# Patient Record
Sex: Male | Born: 1960 | Race: White | Hispanic: No | Marital: Married | Smoking: Never smoker
Health system: Southern US, Community
[De-identification: ages and names within clinical notes are randomized; demographics above are authoritative.]

## PROBLEM LIST (undated history)

## (undated) DIAGNOSIS — Z7722 Contact with and (suspected) exposure to environmental tobacco smoke (acute) (chronic): Secondary | ICD-10-CM

## (undated) DIAGNOSIS — L309 Dermatitis, unspecified: Secondary | ICD-10-CM

## (undated) DIAGNOSIS — G4730 Sleep apnea, unspecified: Secondary | ICD-10-CM

## (undated) DIAGNOSIS — T7840XA Allergy, unspecified, initial encounter: Secondary | ICD-10-CM

## (undated) HISTORY — DX: Allergy, unspecified, initial encounter: T78.40XA

## (undated) HISTORY — DX: Sleep apnea, unspecified: G47.30

## (undated) HISTORY — DX: Contact with and (suspected) exposure to environmental tobacco smoke (acute) (chronic): Z77.22

## (undated) HISTORY — DX: Dermatitis, unspecified: L30.9

---
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
## 1998-11-10 HISTORY — PX: OTHER SURGICAL HISTORY: SHX169

## 2005-09-01 ENCOUNTER — Ambulatory Visit (HOSPITAL_COMMUNITY): Admission: RE | Admit: 2005-09-01 | Discharge: 2005-09-01 | Payer: Self-pay | Admitting: *Deleted

## 2008-09-11 ENCOUNTER — Encounter: Admission: RE | Admit: 2008-09-11 | Discharge: 2008-09-11 | Payer: Self-pay | Admitting: Internal Medicine

## 2011-03-28 NOTE — Op Note (Signed)
Thomas Morrow, Thomas Morrow             ACCOUNT NO.:  [id number]   MEDICAL RECORD NO.:  [id number]          PATIENT TYPE:  AMB   LOCATION:  ENDO                         FACILITY:  [facility]   PHYSICIAN:  Georgiana Spinner, M.D.    DATE OF BIRTH:  1961/07/20   DATE OF PROCEDURE:  09/01/2005  DATE OF DISCHARGE:                                 OPERATIVE REPORT   PROCEDURE:  Colonoscopy.   INDICATIONS:  Rectal bleeding.   ANESTHESIA:  Demerol 60, Versed 8.5 mg.   PROCEDURE:  With the patient mildly sedated in the left lateral decubitus  position, the Olympus videoscopic colonoscope was inserted in the rectum  after a rectal examination was performed which was unremarkable by my exam  and we passed the endoscope under direct vision to the cecum identified by  ileocecal valve and base of cecum both of which were photographed. From this  point, the colonoscope was slowly withdrawn taking circumferential views of  the colonic mucosa stopping only in the rectum which appeared normal on  direct and showed hemorrhoids on retroflexed view the endoscope was  straightened and withdrawn. The patient's vital signs and pulse oximeter  remained stable. The patient tolerated the procedure well without apparent  complications.   FINDINGS:  Internal hemorrhoids otherwise an unremarkable examination.   PLAN:  I have discussed endoscopy with him at this time. He told me he did  have some reflux symptoms 5 or 6 years ago but none since and at some point  he may be someone we might want to do an endoscopy on and I discussed that  but he declined it at this time           ______________________________  Georgiana Spinner, M.D.     GMO/MEDQ  D:  09/01/2005  T:  09/01/2005  Job:  [id number]

## 2014-11-20 ENCOUNTER — Ambulatory Visit
Admission: RE | Admit: 2014-11-20 | Discharge: 2014-11-20 | Disposition: A | Discharge status: Home / Self Care | Payer: BLUE CROSS/BLUE SHIELD | Source: Ambulatory Visit | Attending: Sports Medicine | Admitting: Sports Medicine

## 2014-11-20 ENCOUNTER — Encounter: Payer: Self-pay | Admitting: Sports Medicine

## 2014-11-20 ENCOUNTER — Ambulatory Visit (INDEPENDENT_AMBULATORY_CARE_PROVIDER_SITE_OTHER): Payer: BLUE CROSS/BLUE SHIELD | Admitting: Sports Medicine

## 2014-11-20 VITALS — BP 148/100 | HR 97 | Ht 76.0 in | Wt 230.0 lb

## 2014-11-20 DIAGNOSIS — R0781 Pleurodynia: Secondary | ICD-10-CM | POA: Diagnosis not present

## 2014-11-20 DIAGNOSIS — G8929 Other chronic pain: Secondary | ICD-10-CM

## 2014-11-20 DIAGNOSIS — M898X1 Other specified disorders of bone, shoulder: Secondary | ICD-10-CM

## 2014-11-20 NOTE — Assessment & Plan Note (Addendum)
Tenderness to palpation at posterior rib border below scapula.   Plan:  1) Chest X-ray to evaluate for Rib fracture.  2) Patient did not wish to attempt Neurontin to help with pain.  3) Follow up in one month. Might pursue MRI at that time if pain is worsening.   Addendum: X-rays reviewed. X-rays are unremarkable. Based on his chronicity of symptoms I recommended an MRI of his right scapula to rule out bony edema or soft tissue injury as a cause of his chronic parascapular pain. He will check with his insurance company about the cost of the study. Follow-up with me in 4 weeks as we previously discussed.

## 2014-11-20 NOTE — Progress Notes (Signed)
   Subjective:    Patient ID: Thomas Morrow, male    DOB: 1961/01/06, 54 y.o.   MRN: [id number]  HPI Mr. Marcelle OverlieHolland is a 54 year old right handed male with no relevant PMH who presents with a 4 - 5 month history of R "scapula" pain. Around 5 months ago, Mr. Marcelle OverlieHolland noticed a burning sensation on his right side below his scapula. The burning sensation would come and go over the course of a month. After one month, the burning sensation subsided and a new "bruise" like pain emerged. The "bruise" like pain is tenderness upon palpation in the area. He states the pain has become more noticeable over the past 5 months which led him to come to clinic. He denies the pain limiting his activity nor does activity increase his pain. He denies any injury/ trauma to the area. Furthermore, he denies any history of neck/back pain or surgeries. He denies any numbness or tingling in his upper extremities or weakness in his arms. He has not taken or tried anything in this area because there isn't pain unless he palpates the area.    Review of Systems  Gen: No weight loss. No chills.      Objective:   Physical Exam  Well nourished. Well developed. No acute distress.  BP 148/100 mmHg  Pulse 97  Ht 6\' 4"  (1.93 m)  Wt 230 lb (104.327 kg)  BMI 28.01 kg/m2  Back exam: Right scapula more prominent than left scapula. Tenderness to palpation along posterior rib line below scapula. Normal scapula ROM bilaterally. No winging of scapula noted during movement.   Shoulder: No swelling noted bilaterally. Non-tender to palpation bilaterally. Normal ROM bilaterally. 5/5 shoulder abduction, shoulder internal rotation, shoulder external rotation bilaterally.   Neck: No swelling noted. Non-tender to palpation. Neck ROM normal. Negative Spurling test.        Assessment & Plan:     Note written originally by Ave FilterJosh Dilley, MS4 [facility] [place] and edited/reviewed by Dr. Reino Bellisimothy Satara Virella.

## 2014-12-18 ENCOUNTER — Ambulatory Visit (INDEPENDENT_AMBULATORY_CARE_PROVIDER_SITE_OTHER): Payer: BLUE CROSS/BLUE SHIELD | Admitting: Sports Medicine

## 2014-12-18 ENCOUNTER — Encounter: Payer: Self-pay | Admitting: Sports Medicine

## 2014-12-18 VITALS — BP 152/92 | Ht 76.0 in | Wt 232.0 lb

## 2014-12-18 DIAGNOSIS — M898X1 Other specified disorders of bone, shoulder: Secondary | ICD-10-CM

## 2014-12-18 DIAGNOSIS — G8929 Other chronic pain: Secondary | ICD-10-CM | POA: Diagnosis not present

## 2014-12-18 NOTE — Progress Notes (Signed)
   Subjective:    Patient ID: Thomas Morrow, male    DOB: Nov 06, 1961, 54 y.o.   MRN: [id number]  HPI  Patient comes in today for follow-up on right parascapular pain. Pain is still present primarily with direct pressure over the area. Recent x-rays were unremarkable. He denies any pain with scapula or shoulder motion. No associated numbness or tingling.    Review of Systems     Objective:   Physical Exam Well-developed, well-nourished. No acute distress.  Right shoulder: Full range of motion. There is tenderness to palpation in the infraspinatus muscle belly and there is also a small area of spasm here. No soft tissue swelling. Good scapular motion. Neurovascular intact distally.       Assessment & Plan:  Right scapular pain-question infraspinatus muscle spasm  I had previously discussed the possibility of further diagnostic imaging in the form of an MRI but I think we can hold on that for now. Instead, I would like for him to try working with Thomas Morrow (PT) and follow-up with me in 5 weeks. Based on his response to therapy we may reconsider the MRI follow-up and he will call me with questions or concerns in the interim.

## 2015-01-22 ENCOUNTER — Ambulatory Visit (INDEPENDENT_AMBULATORY_CARE_PROVIDER_SITE_OTHER): Payer: BLUE CROSS/BLUE SHIELD | Admitting: Sports Medicine

## 2015-01-22 VITALS — BP 135/87 | HR 85 | Ht 76.0 in | Wt 232.0 lb

## 2015-01-22 DIAGNOSIS — M898X1 Other specified disorders of bone, shoulder: Secondary | ICD-10-CM | POA: Diagnosis not present

## 2015-01-22 DIAGNOSIS — G8929 Other chronic pain: Secondary | ICD-10-CM | POA: Diagnosis not present

## 2015-01-22 NOTE — Progress Notes (Signed)
   Subjective:    Patient ID: Thomas Morrow, male    DOB: 04-Oct-1961, 54 y.o.   MRN: [id number]  HPI  Patient comes in today for follow-up on right parascapular pain. He is feeling much better. He has minimal pain. He saw Ellamae SiaJohn O'Halloran for 3 visits. John was able to apply some electrical stimulation, massage, and stretching. As a result his symptoms are minimal and certainly tolerable. Previous x-rays were unremarkable.   Review of Systems     Objective:   Physical Exam  Well-developed, well-nourished. No acute distress  Right scapula still shows good mobility. No bony or soft tissue tenderness to palpation. No spasm. Good strength.      Assessment & Plan:  Resolved right parascapular pain  This point in time I see no need for further imaging. Patient may resume activity as tolerated. If symptoms begin to return he will let me know. Otherwise follow-up when necessary.

## 2015-01-22 NOTE — Addendum Note (Signed)
Addended by: Ralene CorkRAPER, Elie R on: 01/22/2015 08:38 AM   Modules accepted: Level of Service

## 2016-03-02 DIAGNOSIS — G4733 Obstructive sleep apnea (adult) (pediatric): Secondary | ICD-10-CM | POA: Diagnosis not present

## 2016-04-01 DIAGNOSIS — G4733 Obstructive sleep apnea (adult) (pediatric): Secondary | ICD-10-CM | POA: Diagnosis not present

## 2016-05-02 DIAGNOSIS — H5213 Myopia, bilateral: Secondary | ICD-10-CM | POA: Diagnosis not present

## 2016-05-02 DIAGNOSIS — G4733 Obstructive sleep apnea (adult) (pediatric): Secondary | ICD-10-CM | POA: Diagnosis not present

## 2016-05-19 DIAGNOSIS — E785 Hyperlipidemia, unspecified: Secondary | ICD-10-CM | POA: Diagnosis not present

## 2016-05-19 DIAGNOSIS — R7309 Other abnormal glucose: Secondary | ICD-10-CM | POA: Diagnosis not present

## 2016-05-26 DIAGNOSIS — E785 Hyperlipidemia, unspecified: Secondary | ICD-10-CM | POA: Diagnosis not present

## 2016-05-26 DIAGNOSIS — R7309 Other abnormal glucose: Secondary | ICD-10-CM | POA: Diagnosis not present

## 2016-12-04 DIAGNOSIS — Z125 Encounter for screening for malignant neoplasm of prostate: Secondary | ICD-10-CM | POA: Diagnosis not present

## 2016-12-04 DIAGNOSIS — Z0000 Encounter for general adult medical examination without abnormal findings: Secondary | ICD-10-CM | POA: Diagnosis not present

## 2016-12-15 DIAGNOSIS — Z1212 Encounter for screening for malignant neoplasm of rectum: Secondary | ICD-10-CM | POA: Diagnosis not present

## 2016-12-15 DIAGNOSIS — Z0001 Encounter for general adult medical examination with abnormal findings: Secondary | ICD-10-CM | POA: Diagnosis not present

## 2016-12-15 DIAGNOSIS — Z91018 Allergy to other foods: Secondary | ICD-10-CM | POA: Diagnosis not present

## 2016-12-15 DIAGNOSIS — Z833 Family history of diabetes mellitus: Secondary | ICD-10-CM | POA: Diagnosis not present

## 2016-12-15 DIAGNOSIS — H9319 Tinnitus, unspecified ear: Secondary | ICD-10-CM | POA: Diagnosis not present

## 2016-12-25 DIAGNOSIS — Z800 Family history of malignant neoplasm of digestive organs: Secondary | ICD-10-CM | POA: Diagnosis not present

## 2016-12-25 DIAGNOSIS — Z1211 Encounter for screening for malignant neoplasm of colon: Secondary | ICD-10-CM | POA: Diagnosis not present

## 2016-12-25 DIAGNOSIS — K5900 Constipation, unspecified: Secondary | ICD-10-CM | POA: Diagnosis not present

## 2017-01-21 DIAGNOSIS — R972 Elevated prostate specific antigen [PSA]: Secondary | ICD-10-CM | POA: Diagnosis not present

## 2017-02-24 DIAGNOSIS — K635 Polyp of colon: Secondary | ICD-10-CM | POA: Diagnosis not present

## 2017-02-24 DIAGNOSIS — Z1211 Encounter for screening for malignant neoplasm of colon: Secondary | ICD-10-CM | POA: Diagnosis not present

## 2017-02-24 DIAGNOSIS — D123 Benign neoplasm of transverse colon: Secondary | ICD-10-CM | POA: Diagnosis not present

## 2017-05-05 DIAGNOSIS — H5213 Myopia, bilateral: Secondary | ICD-10-CM | POA: Diagnosis not present

## 2017-05-21 DIAGNOSIS — R7309 Other abnormal glucose: Secondary | ICD-10-CM | POA: Diagnosis not present

## 2017-05-21 DIAGNOSIS — E785 Hyperlipidemia, unspecified: Secondary | ICD-10-CM | POA: Diagnosis not present

## 2017-05-21 DIAGNOSIS — I10 Essential (primary) hypertension: Secondary | ICD-10-CM | POA: Diagnosis not present

## 2017-06-01 DIAGNOSIS — A09 Infectious gastroenteritis and colitis, unspecified: Secondary | ICD-10-CM | POA: Diagnosis not present

## 2017-06-01 DIAGNOSIS — J309 Allergic rhinitis, unspecified: Secondary | ICD-10-CM | POA: Diagnosis not present

## 2017-06-01 DIAGNOSIS — R7309 Other abnormal glucose: Secondary | ICD-10-CM | POA: Diagnosis not present

## 2017-06-01 DIAGNOSIS — I10 Essential (primary) hypertension: Secondary | ICD-10-CM | POA: Diagnosis not present

## 2017-07-22 DIAGNOSIS — R972 Elevated prostate specific antigen [PSA]: Secondary | ICD-10-CM | POA: Diagnosis not present

## 2017-07-29 DIAGNOSIS — R972 Elevated prostate specific antigen [PSA]: Secondary | ICD-10-CM | POA: Diagnosis not present

## 2017-08-04 DIAGNOSIS — Z23 Encounter for immunization: Secondary | ICD-10-CM | POA: Diagnosis not present

## 2017-12-16 DIAGNOSIS — Z0000 Encounter for general adult medical examination without abnormal findings: Secondary | ICD-10-CM | POA: Diagnosis not present

## 2017-12-16 DIAGNOSIS — Z125 Encounter for screening for malignant neoplasm of prostate: Secondary | ICD-10-CM | POA: Diagnosis not present

## 2017-12-21 DIAGNOSIS — Z23 Encounter for immunization: Secondary | ICD-10-CM | POA: Diagnosis not present

## 2017-12-21 DIAGNOSIS — Z1212 Encounter for screening for malignant neoplasm of rectum: Secondary | ICD-10-CM | POA: Diagnosis not present

## 2017-12-21 DIAGNOSIS — Z0001 Encounter for general adult medical examination with abnormal findings: Secondary | ICD-10-CM | POA: Diagnosis not present

## 2018-02-15 DIAGNOSIS — Z23 Encounter for immunization: Secondary | ICD-10-CM | POA: Diagnosis not present

## 2018-05-07 DIAGNOSIS — H5213 Myopia, bilateral: Secondary | ICD-10-CM | POA: Diagnosis not present

## 2018-12-27 DIAGNOSIS — E785 Hyperlipidemia, unspecified: Secondary | ICD-10-CM | POA: Diagnosis not present

## 2018-12-27 DIAGNOSIS — Z125 Encounter for screening for malignant neoplasm of prostate: Secondary | ICD-10-CM | POA: Diagnosis not present

## 2018-12-27 DIAGNOSIS — I10 Essential (primary) hypertension: Secondary | ICD-10-CM | POA: Diagnosis not present

## 2018-12-30 DIAGNOSIS — L309 Dermatitis, unspecified: Secondary | ICD-10-CM | POA: Diagnosis not present

## 2018-12-30 DIAGNOSIS — R7309 Other abnormal glucose: Secondary | ICD-10-CM | POA: Diagnosis not present

## 2018-12-30 DIAGNOSIS — E785 Hyperlipidemia, unspecified: Secondary | ICD-10-CM | POA: Diagnosis not present

## 2018-12-30 DIAGNOSIS — Z1212 Encounter for screening for malignant neoplasm of rectum: Secondary | ICD-10-CM | POA: Diagnosis not present

## 2018-12-30 DIAGNOSIS — Z7982 Long term (current) use of aspirin: Secondary | ICD-10-CM | POA: Diagnosis not present

## 2018-12-30 DIAGNOSIS — Z0001 Encounter for general adult medical examination with abnormal findings: Secondary | ICD-10-CM | POA: Diagnosis not present

## 2018-12-30 DIAGNOSIS — Z23 Encounter for immunization: Secondary | ICD-10-CM | POA: Diagnosis not present

## 2018-12-30 DIAGNOSIS — I10 Essential (primary) hypertension: Secondary | ICD-10-CM | POA: Diagnosis not present

## 2019-01-10 DIAGNOSIS — L570 Actinic keratosis: Secondary | ICD-10-CM | POA: Diagnosis not present

## 2019-01-10 DIAGNOSIS — X32XXXA Exposure to sunlight, initial encounter: Secondary | ICD-10-CM | POA: Diagnosis not present

## 2019-01-10 DIAGNOSIS — D225 Melanocytic nevi of trunk: Secondary | ICD-10-CM | POA: Diagnosis not present

## 2019-01-10 DIAGNOSIS — L308 Other specified dermatitis: Secondary | ICD-10-CM | POA: Diagnosis not present

## 2019-01-10 DIAGNOSIS — Z1283 Encounter for screening for malignant neoplasm of skin: Secondary | ICD-10-CM | POA: Diagnosis not present

## 2019-06-22 DIAGNOSIS — H5213 Myopia, bilateral: Secondary | ICD-10-CM | POA: Diagnosis not present

## 2020-01-09 DIAGNOSIS — Z79899 Other long term (current) drug therapy: Secondary | ICD-10-CM | POA: Diagnosis not present

## 2020-01-09 DIAGNOSIS — Z0000 Encounter for general adult medical examination without abnormal findings: Secondary | ICD-10-CM | POA: Diagnosis not present

## 2020-01-09 DIAGNOSIS — Z125 Encounter for screening for malignant neoplasm of prostate: Secondary | ICD-10-CM | POA: Diagnosis not present

## 2020-01-09 DIAGNOSIS — E7800 Pure hypercholesterolemia, unspecified: Secondary | ICD-10-CM | POA: Diagnosis not present

## 2020-01-12 DIAGNOSIS — I10 Essential (primary) hypertension: Secondary | ICD-10-CM | POA: Diagnosis not present

## 2020-01-12 DIAGNOSIS — R7309 Other abnormal glucose: Secondary | ICD-10-CM | POA: Diagnosis not present

## 2020-01-12 DIAGNOSIS — E785 Hyperlipidemia, unspecified: Secondary | ICD-10-CM | POA: Diagnosis not present

## 2020-01-12 DIAGNOSIS — Z0001 Encounter for general adult medical examination with abnormal findings: Secondary | ICD-10-CM | POA: Diagnosis not present

## 2020-01-16 ENCOUNTER — Other Ambulatory Visit: Payer: Self-pay | Admitting: Internal Medicine

## 2020-01-16 ENCOUNTER — Other Ambulatory Visit (HOSPITAL_COMMUNITY): Payer: Self-pay | Admitting: Internal Medicine

## 2020-01-16 DIAGNOSIS — R0989 Other specified symptoms and signs involving the circulatory and respiratory systems: Secondary | ICD-10-CM

## 2020-01-23 ENCOUNTER — Ambulatory Visit (HOSPITAL_COMMUNITY)
Admission: RE | Admit: 2020-01-23 | Discharge: 2020-01-23 | Disposition: A | Discharge status: Home / Self Care | Payer: BC Managed Care – PPO | Source: Ambulatory Visit | Attending: Internal Medicine | Admitting: Internal Medicine

## 2020-01-23 ENCOUNTER — Encounter (HOSPITAL_COMMUNITY): Payer: BLUE CROSS/BLUE SHIELD

## 2020-01-23 ENCOUNTER — Other Ambulatory Visit: Payer: Self-pay

## 2020-01-23 DIAGNOSIS — R0989 Other specified symptoms and signs involving the circulatory and respiratory systems: Secondary | ICD-10-CM | POA: Diagnosis not present

## 2020-06-27 DIAGNOSIS — H5213 Myopia, bilateral: Secondary | ICD-10-CM | POA: Diagnosis not present

## 2020-10-22 DIAGNOSIS — L570 Actinic keratosis: Secondary | ICD-10-CM | POA: Diagnosis not present

## 2020-10-22 DIAGNOSIS — B070 Plantar wart: Secondary | ICD-10-CM | POA: Diagnosis not present

## 2020-10-22 DIAGNOSIS — Z1283 Encounter for screening for malignant neoplasm of skin: Secondary | ICD-10-CM | POA: Diagnosis not present

## 2020-10-22 DIAGNOSIS — X32XXXD Exposure to sunlight, subsequent encounter: Secondary | ICD-10-CM | POA: Diagnosis not present

## 2021-01-15 DIAGNOSIS — I10 Essential (primary) hypertension: Secondary | ICD-10-CM | POA: Diagnosis not present

## 2021-01-15 DIAGNOSIS — Z125 Encounter for screening for malignant neoplasm of prostate: Secondary | ICD-10-CM | POA: Diagnosis not present

## 2021-01-15 DIAGNOSIS — Z0000 Encounter for general adult medical examination without abnormal findings: Secondary | ICD-10-CM | POA: Diagnosis not present

## 2021-01-18 DIAGNOSIS — J309 Allergic rhinitis, unspecified: Secondary | ICD-10-CM | POA: Diagnosis not present

## 2021-01-18 DIAGNOSIS — Z8249 Family history of ischemic heart disease and other diseases of the circulatory system: Secondary | ICD-10-CM | POA: Diagnosis not present

## 2021-01-18 DIAGNOSIS — I10 Essential (primary) hypertension: Secondary | ICD-10-CM | POA: Diagnosis not present

## 2021-01-18 DIAGNOSIS — E785 Hyperlipidemia, unspecified: Secondary | ICD-10-CM | POA: Diagnosis not present

## 2021-01-18 DIAGNOSIS — Z0001 Encounter for general adult medical examination with abnormal findings: Secondary | ICD-10-CM | POA: Diagnosis not present

## 2021-01-21 ENCOUNTER — Other Ambulatory Visit: Payer: Self-pay | Admitting: Internal Medicine

## 2021-01-21 DIAGNOSIS — Z8249 Family history of ischemic heart disease and other diseases of the circulatory system: Secondary | ICD-10-CM

## 2021-02-06 ENCOUNTER — Ambulatory Visit
Admission: RE | Admit: 2021-02-06 | Discharge: 2021-02-06 | Disposition: A | Discharge status: Home / Self Care | Payer: BC Managed Care – PPO | Source: Ambulatory Visit | Attending: Internal Medicine | Admitting: Internal Medicine

## 2021-02-06 DIAGNOSIS — Z8249 Family history of ischemic heart disease and other diseases of the circulatory system: Secondary | ICD-10-CM

## 2021-02-11 ENCOUNTER — Other Ambulatory Visit (HOSPITAL_COMMUNITY): Payer: Self-pay

## 2021-02-11 DIAGNOSIS — R911 Solitary pulmonary nodule: Secondary | ICD-10-CM | POA: Diagnosis not present

## 2021-02-11 DIAGNOSIS — E785 Hyperlipidemia, unspecified: Secondary | ICD-10-CM | POA: Diagnosis not present

## 2021-02-11 DIAGNOSIS — I2510 Atherosclerotic heart disease of native coronary artery without angina pectoris: Secondary | ICD-10-CM | POA: Diagnosis not present

## 2021-02-11 DIAGNOSIS — I10 Essential (primary) hypertension: Secondary | ICD-10-CM | POA: Diagnosis not present

## 2021-02-11 MED ORDER — REPATHA SURECLICK 140 MG/ML ~~LOC~~ SOAJ
SUBCUTANEOUS | 11 refills | Status: AC
  Filled 2021-02-11 – 2021-02-15 (×2): qty 2, 28d supply, fill #0
  Filled 2021-03-11: qty 2, 28d supply, fill #1
  Filled 2021-04-11: qty 2, 28d supply, fill #2
  Filled 2021-05-09: qty 2, 28d supply, fill #3

## 2021-02-15 ENCOUNTER — Other Ambulatory Visit (HOSPITAL_COMMUNITY): Payer: Self-pay

## 2021-03-11 ENCOUNTER — Other Ambulatory Visit (HOSPITAL_COMMUNITY): Payer: Self-pay

## 2021-03-14 ENCOUNTER — Other Ambulatory Visit (HOSPITAL_COMMUNITY): Payer: Self-pay

## 2021-04-04 ENCOUNTER — Other Ambulatory Visit: Payer: Self-pay

## 2021-04-09 DIAGNOSIS — E785 Hyperlipidemia, unspecified: Secondary | ICD-10-CM | POA: Diagnosis not present

## 2021-04-11 ENCOUNTER — Other Ambulatory Visit (HOSPITAL_COMMUNITY): Payer: Self-pay

## 2021-04-15 ENCOUNTER — Other Ambulatory Visit (HOSPITAL_COMMUNITY): Payer: Self-pay

## 2021-05-07 ENCOUNTER — Other Ambulatory Visit (HOSPITAL_COMMUNITY): Payer: Self-pay

## 2021-05-09 ENCOUNTER — Other Ambulatory Visit (HOSPITAL_COMMUNITY): Payer: Self-pay

## 2021-05-30 ENCOUNTER — Other Ambulatory Visit (HOSPITAL_COMMUNITY): Payer: Self-pay

## 2021-05-30 DIAGNOSIS — E785 Hyperlipidemia, unspecified: Secondary | ICD-10-CM | POA: Diagnosis not present

## 2021-05-30 DIAGNOSIS — I2510 Atherosclerotic heart disease of native coronary artery without angina pectoris: Secondary | ICD-10-CM | POA: Diagnosis not present

## 2021-05-30 DIAGNOSIS — M791 Myalgia, unspecified site: Secondary | ICD-10-CM | POA: Diagnosis not present

## 2021-05-30 DIAGNOSIS — I10 Essential (primary) hypertension: Secondary | ICD-10-CM | POA: Diagnosis not present

## 2021-05-30 MED ORDER — PRALUENT 75 MG/ML ~~LOC~~ SOAJ
SUBCUTANEOUS | 11 refills | Status: DC
  Filled 2021-05-30 – 2021-06-26 (×5): qty 2, 28d supply, fill #0
  Filled 2021-07-24: qty 2, 28d supply, fill #1
  Filled 2021-08-19: qty 2, 28d supply, fill #2
  Filled 2021-09-17: qty 2, 28d supply, fill #3
  Filled 2021-10-15: qty 2, 28d supply, fill #4
  Filled 2021-11-18: qty 2, 28d supply, fill #5
  Filled 2021-12-09: qty 2, 28d supply, fill #6
  Filled 2022-01-03: qty 2, 28d supply, fill #7
  Filled 2022-01-28: qty 2, 28d supply, fill #8
  Filled 2022-02-28: qty 2, 28d supply, fill #9
  Filled 2022-04-01: qty 2, 28d supply, fill #10

## 2021-05-31 ENCOUNTER — Other Ambulatory Visit (HOSPITAL_COMMUNITY): Payer: Self-pay

## 2021-06-03 ENCOUNTER — Other Ambulatory Visit (HOSPITAL_COMMUNITY): Payer: Self-pay

## 2021-06-04 ENCOUNTER — Other Ambulatory Visit (HOSPITAL_COMMUNITY): Payer: Self-pay

## 2021-06-06 ENCOUNTER — Other Ambulatory Visit (HOSPITAL_COMMUNITY): Payer: Self-pay

## 2021-06-26 ENCOUNTER — Other Ambulatory Visit (HOSPITAL_COMMUNITY): Payer: Self-pay

## 2021-07-02 DIAGNOSIS — H5213 Myopia, bilateral: Secondary | ICD-10-CM | POA: Diagnosis not present

## 2021-07-10 DIAGNOSIS — J328 Other chronic sinusitis: Secondary | ICD-10-CM | POA: Diagnosis not present

## 2021-07-10 DIAGNOSIS — R42 Dizziness and giddiness: Secondary | ICD-10-CM | POA: Diagnosis not present

## 2021-07-24 ENCOUNTER — Other Ambulatory Visit (HOSPITAL_COMMUNITY): Payer: Self-pay

## 2021-08-06 ENCOUNTER — Other Ambulatory Visit: Payer: Self-pay | Admitting: Internal Medicine

## 2021-08-06 DIAGNOSIS — J328 Other chronic sinusitis: Secondary | ICD-10-CM

## 2021-08-19 ENCOUNTER — Other Ambulatory Visit (HOSPITAL_COMMUNITY): Payer: Self-pay

## 2021-08-21 DIAGNOSIS — I10 Essential (primary) hypertension: Secondary | ICD-10-CM | POA: Diagnosis not present

## 2021-08-21 DIAGNOSIS — E785 Hyperlipidemia, unspecified: Secondary | ICD-10-CM | POA: Diagnosis not present

## 2021-08-21 DIAGNOSIS — Z7982 Long term (current) use of aspirin: Secondary | ICD-10-CM | POA: Diagnosis not present

## 2021-08-22 ENCOUNTER — Other Ambulatory Visit (HOSPITAL_COMMUNITY): Payer: Self-pay

## 2021-08-26 ENCOUNTER — Ambulatory Visit
Admission: RE | Admit: 2021-08-26 | Discharge: 2021-08-26 | Disposition: A | Discharge status: Home / Self Care | Payer: BC Managed Care – PPO | Source: Ambulatory Visit | Attending: Internal Medicine | Admitting: Internal Medicine

## 2021-08-26 ENCOUNTER — Other Ambulatory Visit: Payer: Self-pay

## 2021-08-26 DIAGNOSIS — J329 Chronic sinusitis, unspecified: Secondary | ICD-10-CM | POA: Diagnosis not present

## 2021-08-26 DIAGNOSIS — J328 Other chronic sinusitis: Secondary | ICD-10-CM

## 2021-08-30 DIAGNOSIS — E785 Hyperlipidemia, unspecified: Secondary | ICD-10-CM | POA: Diagnosis not present

## 2021-08-30 DIAGNOSIS — M791 Myalgia, unspecified site: Secondary | ICD-10-CM | POA: Diagnosis not present

## 2021-08-30 DIAGNOSIS — I10 Essential (primary) hypertension: Secondary | ICD-10-CM | POA: Diagnosis not present

## 2021-08-30 DIAGNOSIS — I2510 Atherosclerotic heart disease of native coronary artery without angina pectoris: Secondary | ICD-10-CM | POA: Diagnosis not present

## 2021-09-17 ENCOUNTER — Other Ambulatory Visit (HOSPITAL_COMMUNITY): Payer: Self-pay

## 2021-09-18 ENCOUNTER — Other Ambulatory Visit (HOSPITAL_COMMUNITY): Payer: Self-pay

## 2021-10-15 ENCOUNTER — Other Ambulatory Visit (HOSPITAL_COMMUNITY): Payer: Self-pay

## 2021-10-21 ENCOUNTER — Other Ambulatory Visit (HOSPITAL_COMMUNITY): Payer: Self-pay

## 2021-11-18 ENCOUNTER — Other Ambulatory Visit (HOSPITAL_COMMUNITY): Payer: Self-pay

## 2021-11-18 DIAGNOSIS — H903 Sensorineural hearing loss, bilateral: Secondary | ICD-10-CM | POA: Diagnosis not present

## 2021-11-18 DIAGNOSIS — J342 Deviated nasal septum: Secondary | ICD-10-CM | POA: Diagnosis not present

## 2021-11-18 DIAGNOSIS — R42 Dizziness and giddiness: Secondary | ICD-10-CM | POA: Diagnosis not present

## 2021-11-18 DIAGNOSIS — J343 Hypertrophy of nasal turbinates: Secondary | ICD-10-CM | POA: Diagnosis not present

## 2021-11-18 DIAGNOSIS — J320 Chronic maxillary sinusitis: Secondary | ICD-10-CM | POA: Diagnosis not present

## 2021-12-09 ENCOUNTER — Other Ambulatory Visit (HOSPITAL_COMMUNITY): Payer: Self-pay

## 2021-12-11 ENCOUNTER — Other Ambulatory Visit (HOSPITAL_COMMUNITY): Payer: Self-pay

## 2022-01-03 ENCOUNTER — Other Ambulatory Visit (HOSPITAL_COMMUNITY): Payer: Self-pay

## 2022-01-09 ENCOUNTER — Other Ambulatory Visit (HOSPITAL_COMMUNITY): Payer: Self-pay

## 2022-01-28 ENCOUNTER — Other Ambulatory Visit (HOSPITAL_COMMUNITY): Payer: Self-pay

## 2022-02-05 ENCOUNTER — Other Ambulatory Visit (HOSPITAL_COMMUNITY): Payer: Self-pay

## 2022-02-06 ENCOUNTER — Other Ambulatory Visit (HOSPITAL_COMMUNITY): Payer: Self-pay

## 2022-02-28 ENCOUNTER — Other Ambulatory Visit (HOSPITAL_COMMUNITY): Payer: Self-pay

## 2022-03-06 ENCOUNTER — Other Ambulatory Visit (HOSPITAL_COMMUNITY): Payer: Self-pay

## 2022-03-22 IMAGING — CT CT CARDIAC CORONARY ARTERY CALCIUM SCORE
3 series · 14 of 20 positions shown, 16 images · non-contrast
Comparison: None.

CLINICAL DATA: 59-year-old white male with family history of
premature coronary artery disease.

EXAM:
CT CARDIAC CORONARY ARTERY CALCIUM SCORE
TECHNIQUE: Non-contrast imaging through the heart was performed using
prospective ECG gating. Image post processing was performed on an
independent workstation, allowing for quantitative analysis of the
heart and coronary arteries. Note that this exam targets the heart
and the chest was not imaged in its entirety.

[Series 2: calcium scoring 2.00 qr36 bestdiast 70% hrt calciu · axial · 0.48mm/px · z∈[+1708,+1804]mm · 4 of 80 slices shown]
[im 16/80  vessel]
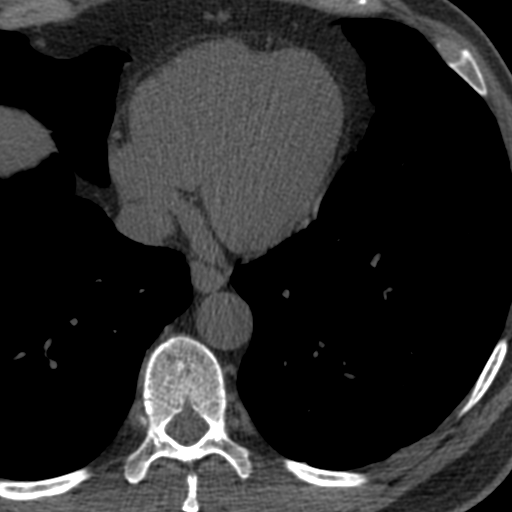
[im 32/80  vessel]
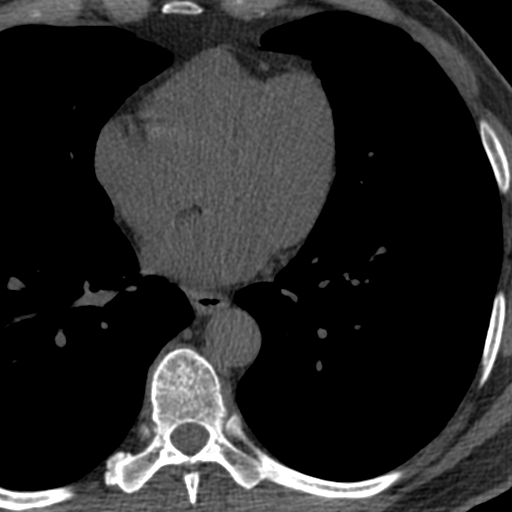
[im 48/80  vessel]
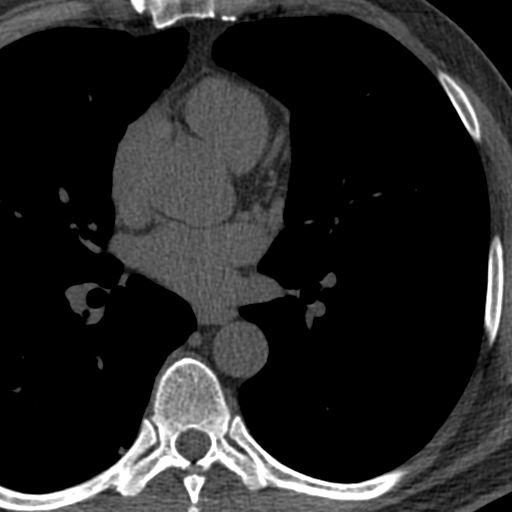
[im 64/80  vessel]
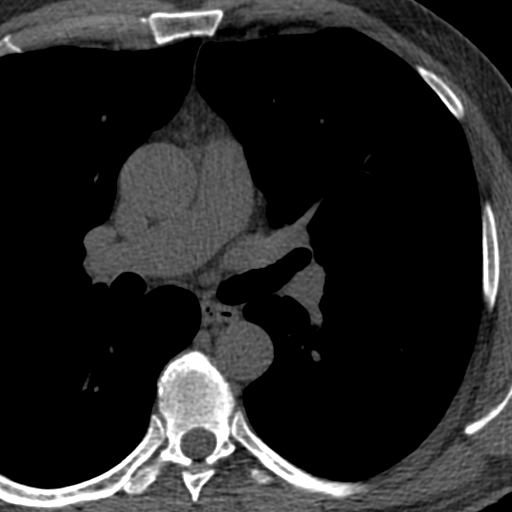

[Series 3: calcium scoring 2.00 br40 bestdiast 70% axial · axial · 0.68mm/px · z∈[+1704,+1808]mm · 5 of 80 slices shown, 7 images]
[im 14/80  vessel]
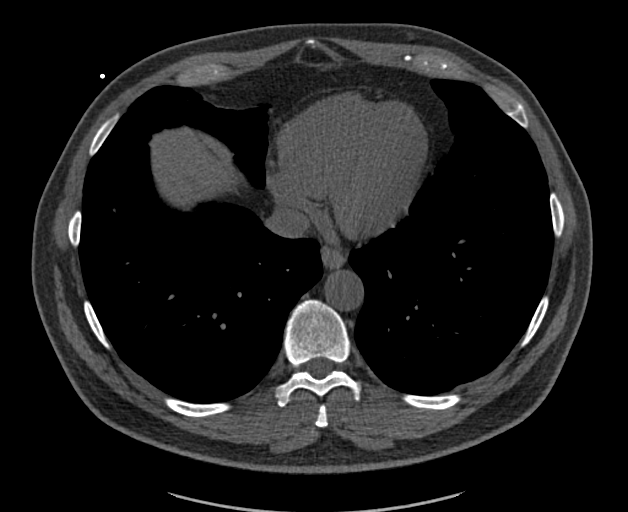
[im 14/80  lung]
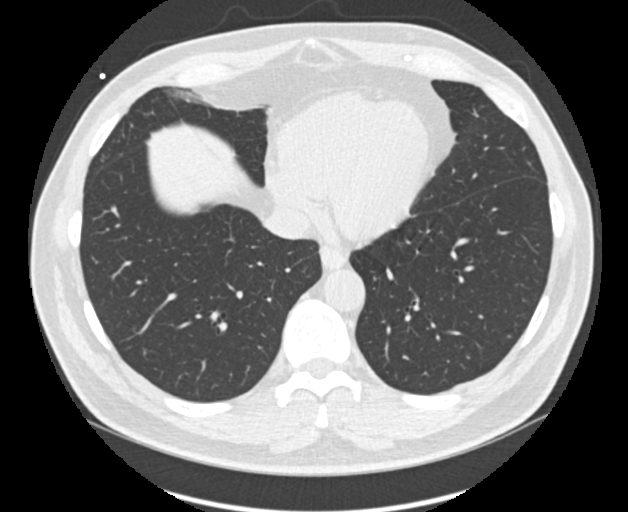
[im 27/80  vessel]
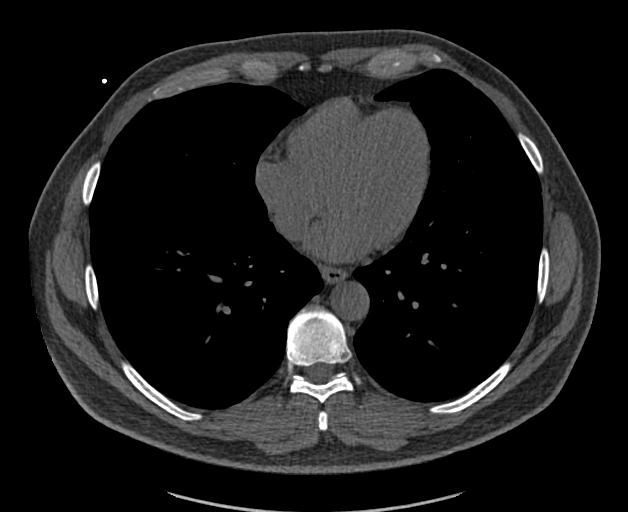
[im 40/80  vessel]
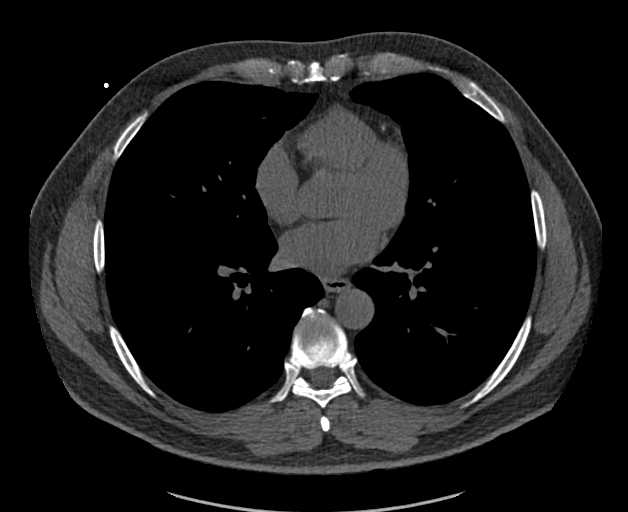
[im 53/80  vessel]
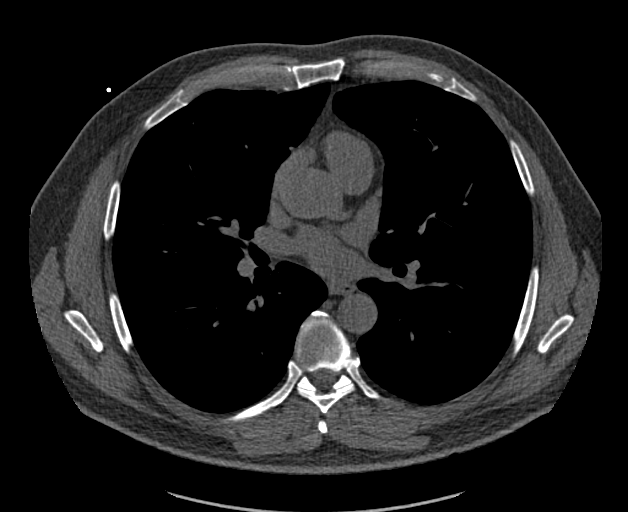
[im 66/80  vessel]
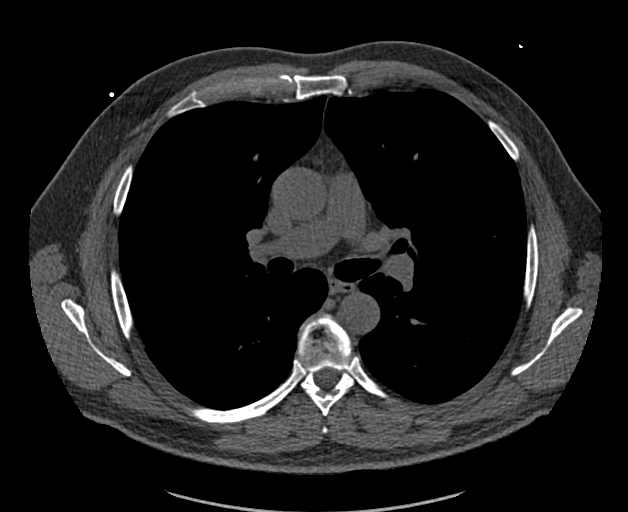
[im 66/80  lung]
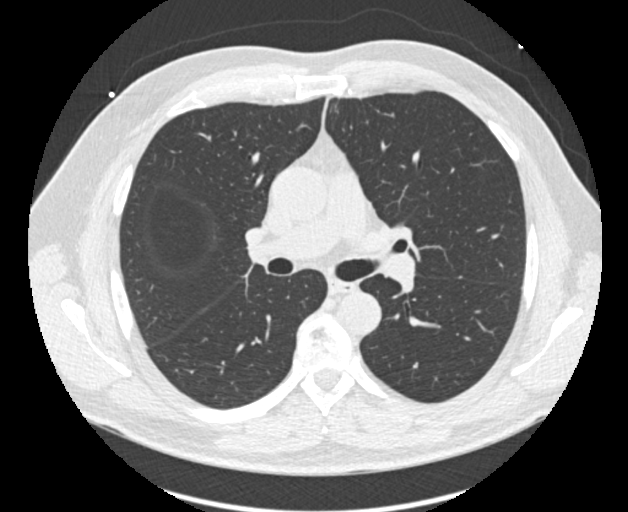

[Series 9: calcium scoring 2.00 br60 bestdiast 70% lungs · axial · 0.68mm/px · z∈[+1704,+1808]mm · 5 of 80 slices shown]
[im 14/80  vessel]
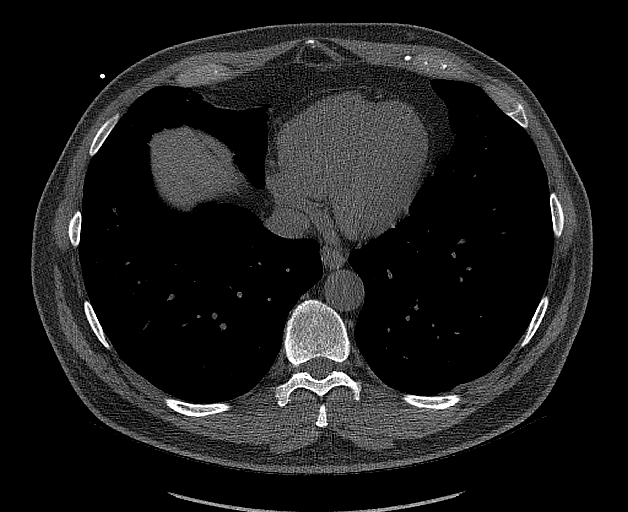
[im 27/80  vessel]
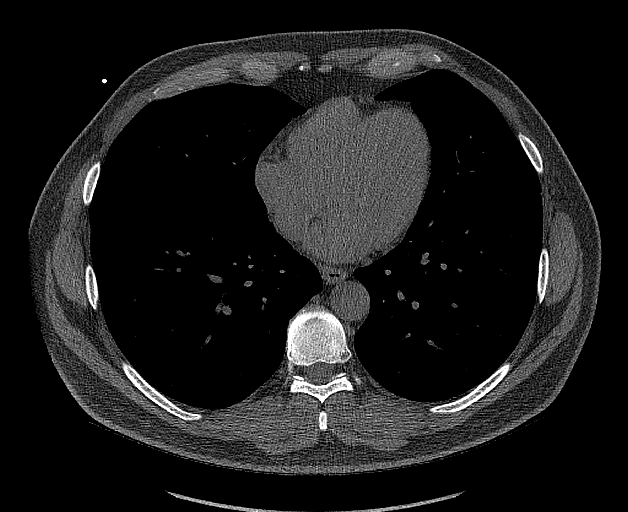
[im 40/80  vessel]
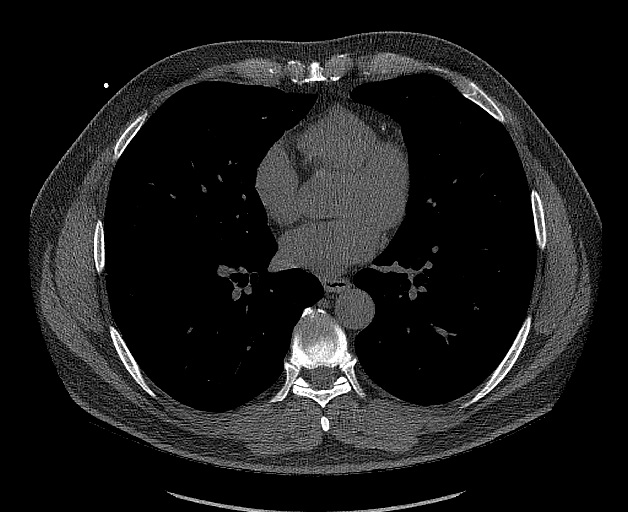
[im 53/80  vessel]
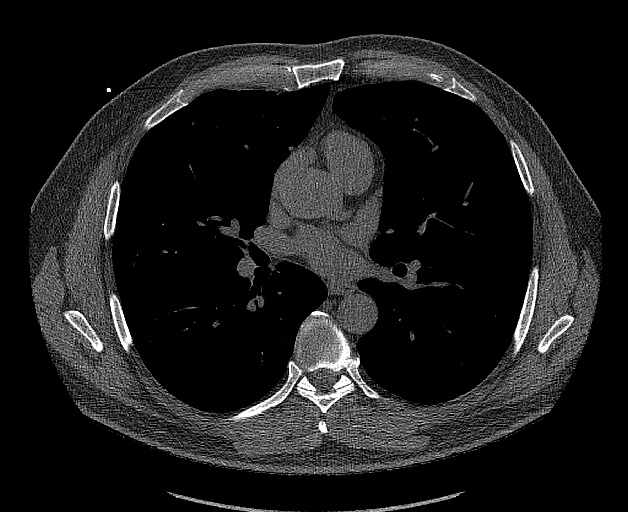
[im 66/80  vessel]
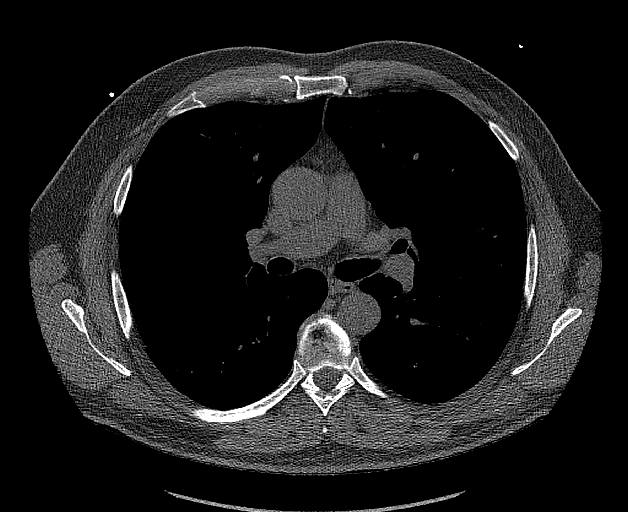

[14 of 20 positions shown; findings below may reference images not displayed]

FINDINGS: CORONARY CALCIUM SCORES:

Left Main: 0

LAD:

LCx:

RCA:

Total Agatston Score:

[HOSPITAL] percentile: 55

AORTA MEASUREMENTS:

Ascending Aorta: 36 mm

Descending Aorta: 28 mm

OTHER FINDINGS:

Heart size is normal. No significant pericardial fluid. Visualized
mediastinal structures are unremarkable. Images of upper abdomen are
unremarkable. No large pleural effusions. 4 mm nodule in the
anterior right upper lobe on sequence 9, image 10. 4 mm nodule in
the right middle lobe on image 49. No significant consolidation or
airspace disease in the visualized lungs. Pleural-based nodule in
the posterior right lower lobe measures 4 mm on image 33. Additional
punctate nodules in both lungs. No acute bone abnormality.
IMPRESSION: 1. Coronary calcium score is 53.1 and this is at percentile 55 for
patients of the same age, gender and ethnicity.
2. Several small nodules scattered throughout the visualized lungs,
largest measuring 4 mm. These small nodules are indeterminate. No
follow-up needed if patient is low-risk (and has no known or
suspected primary neoplasm). Non-contrast chest CT can be considered
in 12 months if patient is high-risk. This recommendation follows
the consensus statement: Guidelines for Management of Incidental
Pulmonary Nodules Detected on CT Images: From the [HOSPITAL]

## 2022-04-01 ENCOUNTER — Other Ambulatory Visit (HOSPITAL_COMMUNITY): Payer: Self-pay

## 2022-04-03 ENCOUNTER — Other Ambulatory Visit (HOSPITAL_COMMUNITY): Payer: Self-pay

## 2022-04-17 DIAGNOSIS — Z125 Encounter for screening for malignant neoplasm of prostate: Secondary | ICD-10-CM | POA: Diagnosis not present

## 2022-04-17 DIAGNOSIS — Z0000 Encounter for general adult medical examination without abnormal findings: Secondary | ICD-10-CM | POA: Diagnosis not present

## 2022-04-17 DIAGNOSIS — R7309 Other abnormal glucose: Secondary | ICD-10-CM | POA: Diagnosis not present

## 2022-04-24 DIAGNOSIS — I10 Essential (primary) hypertension: Secondary | ICD-10-CM | POA: Diagnosis not present

## 2022-04-24 DIAGNOSIS — I2510 Atherosclerotic heart disease of native coronary artery without angina pectoris: Secondary | ICD-10-CM | POA: Diagnosis not present

## 2022-04-24 DIAGNOSIS — G4733 Obstructive sleep apnea (adult) (pediatric): Secondary | ICD-10-CM | POA: Diagnosis not present

## 2022-04-24 DIAGNOSIS — Z6830 Body mass index (BMI) 30.0-30.9, adult: Secondary | ICD-10-CM | POA: Diagnosis not present

## 2022-04-24 DIAGNOSIS — Z0000 Encounter for general adult medical examination without abnormal findings: Secondary | ICD-10-CM | POA: Diagnosis not present

## 2022-04-24 DIAGNOSIS — B070 Plantar wart: Secondary | ICD-10-CM | POA: Diagnosis not present

## 2022-04-28 ENCOUNTER — Other Ambulatory Visit (HOSPITAL_COMMUNITY): Payer: Self-pay

## 2022-04-30 ENCOUNTER — Other Ambulatory Visit: Payer: Self-pay | Admitting: Internal Medicine

## 2022-04-30 DIAGNOSIS — R911 Solitary pulmonary nodule: Secondary | ICD-10-CM

## 2022-05-29 ENCOUNTER — Inpatient Hospital Stay: Admission: RE | Admit: 2022-05-29 | Payer: BC Managed Care – PPO | Source: Ambulatory Visit

## 2022-05-30 ENCOUNTER — Encounter: Payer: Self-pay | Admitting: Primary Care

## 2022-05-30 ENCOUNTER — Telehealth: Payer: Self-pay | Admitting: Primary Care

## 2022-05-30 ENCOUNTER — Ambulatory Visit (INDEPENDENT_AMBULATORY_CARE_PROVIDER_SITE_OTHER): Payer: BC Managed Care – PPO | Admitting: Primary Care

## 2022-05-30 VITALS — BP 128/84 | HR 83 | Temp 98.6°F | Ht 76.0 in | Wt 253.4 lb

## 2022-05-30 DIAGNOSIS — Z9989 Dependence on other enabling machines and devices: Secondary | ICD-10-CM | POA: Diagnosis not present

## 2022-05-30 DIAGNOSIS — G4733 Obstructive sleep apnea (adult) (pediatric): Secondary | ICD-10-CM | POA: Diagnosis not present

## 2022-05-30 DIAGNOSIS — R911 Solitary pulmonary nodule: Secondary | ICD-10-CM | POA: Insufficient documentation

## 2022-05-30 NOTE — Assessment & Plan Note (Signed)
-   Reviewed Dr. Jackson Latino note from 04/26/2022.  Patient has been ordered for CT chest without contrast to follow-up on lung nodule, scheduled for 06/25/2022.

## 2022-05-30 NOTE — Telephone Encounter (Signed)
With it being after 5pm, this will have to wait until Monday. Due to me being on vacation next week, routing this to triage for follow up.

## 2022-05-30 NOTE — Telephone Encounter (Addendum)
Patient seen today for sleep consult due to history of sleep apnea.  He had a sleep study inFeb 2017 with [facility], results are not available in chart.    CPAP was originally provided by [facility] in [place] [phone].  Can we call them to see if they have a copy of his sleep study on file.  We have placed an order for patient to receive a new CPAP machine through them as current one is greater than 63 years old.  No compliance report due to 3G cut off.  Or see if we can get copy from Dr. Renne Crigler office, it was supposed to be sent but not with chart notes that I reviewed. Their number is [phone]

## 2022-05-30 NOTE — Assessment & Plan Note (Signed)
-   History of sleep apnea currently on CPAP.  Original sleep study done in February 2017 at [facility].  Patient reports compliance with CPAP use.  No issues with pressure setting or mask fit.  Current CPAP machine is greater than 5 years.  No compliance report due to 3G cut off.  DME company is [facility] in [place].  We will place an order for patient to receive a new CPAP machine at current pressure setting 9 cm H2O.  Advised patient aim to wear CPAP every night for minimum 4 to 6 hours or longer.  Advised patient follow-up in 1 year or sooner if needed.

## 2022-05-30 NOTE — Progress Notes (Signed)
@Patient  ID: , male    DOB: 10/29/1961, 61 y.o.   MRN: [id number]  Chief Complaint  Patient presents with   Consult    Pt states that his machine is over 63 years old. Old machine does not give report.    Referring provider: 9, MD  HPI: 61 year old male, never smoked.  Past medical history significant for hypertension, OSA, coronary artery disease, prediabetes, pulmonary nodule.  05/30/2022 Patient presents today for sleep consult. Patient needs new CPAP machine. Original sleep study was done at [facility] in Feb 2017 through his primary care physician Dr. Mar 2017. He received CPAP machine through [facility]. He has a resmed airsense 10 machine. He purchases his CPAP supplies online. He uses a resmed nasal pillow mask. He reguarly changes filter and clearns his tubing/water chamber. No issues with mask or pressure.  Current CPAP pressure 9cm h20. Sleeping ok at night. Typically gets 4-5 hours of sleep a night. Daytime fatigue improved with CPAP use.    Sleep questionnaire Symptoms-  Needs new CPAP machine  Prior sleep study- Feb 2017 [facility] Bedtime- 10-11pm Time to fall asleep- usually quickly  Nocturnal awakenings- minimal Out of bed/start of day- 5:20am Weight changes- NA Do you operate heavy machinery- No Do you currently wear CPAP- Yes, 9cm h20 Do you current wear oxygen- No Epworth- 7  Allergies  Allergen Reactions   Simvastatin Other (See Comments)    Immunization History  Administered Date(s) Administered   Hepatitis A, Adult 03/17/1996, 11/17/1996   Influenza-Unspecified 08/24/2012, 08/10/2016   PFIZER(Purple Top)SARS-COV-2 Vaccination 01/13/2020   Tdap 08/07/2008   Typhoid Inactivated 03/17/1996   Zoster Recombinat (Shingrix) 12/21/2017, 02/15/2018    No past medical history on file.  Tobacco History: Social History   Tobacco Use  Smoking Status Never   Passive exposure: Past  Smokeless Tobacco Never    Counseling given: Not Answered   Outpatient Medications Prior to Visit  Medication Sig Dispense Refill   Alirocumab (PRALUENT) 75 MG/ML SOAJ Inject 75 mg subcutaneous every 14 days, Replaces Repatha 2 mL 11   DYMISTA 137-50 MCG/ACT SUSP Place 1 spray into both nostrils 2 (two) times daily.  12   loratadine-pseudoephedrine (CLARITIN-D 12 HOUR) 5-120 MG tablet 1 tablet as needed     omega-3 acid ethyl esters (LOVAZA) 1 G capsule      Pediatric Multivitamins-Fl (MULTIVITAMINS/FL PO) Take 1 tablet by mouth once.     aspirin EC 81 MG tablet 1 tablet     Evolocumab (REPATHA SURECLICK) 140 MG/ML SOAJ Inject 140 mg under the skin every 14 days. (Patient not taking: Reported on 05/30/2022) 2 mL 11   fluticasone (FLONASE) 50 MCG/ACT nasal spray  (Patient not taking: Reported on 05/30/2022)     LIVALO 2 MG TABS      No facility-administered medications prior to visit.   Review of Systems  Review of Systems  Constitutional: Negative.   HENT: Negative.    Respiratory: Negative.    Psychiatric/Behavioral: Negative.      Physical Exam  BP 128/84 (BP Location: Left Arm, Patient Position: Sitting, Cuff Size: Large)   Pulse 83   Temp 98.6 F (37 C) (Oral)   Ht 6\' 4"  (1.93 m)   Wt 253 lb 6.4 oz (114.9 kg)   SpO2 97%   BMI 30.84 kg/m  Physical Exam Constitutional:      Appearance: Normal appearance.  HENT:     Head: Normocephalic and atraumatic.  Cardiovascular:     Rate  and Rhythm: Normal rate and regular rhythm.  Pulmonary:     Effort: Pulmonary effort is normal.     Breath sounds: Normal breath sounds.     Comments: CTA Musculoskeletal:        General: Normal range of motion.  Skin:    General: Skin is warm and dry.  Neurological:     General: No focal deficit present.     Mental Status: He is alert and oriented to person, place, and time. Mental status is at baseline.  Psychiatric:        Mood and Affect: Mood normal.        Behavior: Behavior normal.        Thought  Content: Thought content normal.        Judgment: Judgment normal.      Lab Results:  CBC No results found for: "WBC", "RBC", "HGB", "HCT", "PLT", "MCV", "MCH", "MCHC", "RDW", "LYMPHSABS", "MONOABS", "EOSABS", "BASOSABS"  BMET No results found for: "NA", "K", "CL", "CO2", "GLUCOSE", "BUN", "CREATININE", "CALCIUM", "GFRNONAA", "GFRAA"  BNP No results found for: "BNP"  ProBNP No results found for: "PROBNP"  Imaging: No results found.   Assessment & Plan:   OSA on CPAP - History of sleep apnea currently on CPAP.  Original sleep study done in February 2017 at [facility].  Patient reports compliance with CPAP use.  No issues with pressure setting or mask fit.  Current CPAP machine is greater than 5 years.  No compliance report due to 3G cut off.  DME company is [facility] in [place].  We will place an order for patient to receive a new CPAP machine at current pressure setting 9 cm H2O.  Advised patient aim to wear CPAP every night for minimum 4 to 6 hours or longer.  Advised patient follow-up in 1 year or sooner if needed.  Pulmonary nodule - Reviewed Dr. Jackson Latino note from 04/26/2022.  Patient has been ordered for CT chest without contrast to follow-up on lung nodule, scheduled for 06/25/2022.   Glenford Bayley, NP 05/30/2022

## 2022-05-30 NOTE — Patient Instructions (Addendum)
Recommendations: Aim to wear CPAP ever night for min 4-6 hours Focus on side sleeping position Do not drive if experiencing excessive daytime sleepiness of fatigue  Call our office in 2 to 3 weeks if you have not heard from DME company about replacement CPAP machine   Orders: CPAP machine 9cm h20  Requesting sleeping study results from [facility] (sent telephone message)   Follow-up: 1 year with [facility] NP for OSA follow-up   CPAP and BIPAP Information CPAP and BIPAP are methods that use air pressure to keep your airways open and to help you breathe well. CPAP and BIPAP use different amounts of pressure. Your health care provider will tell you whether CPAP or BIPAP would be more helpful for you. CPAP stands for "continuous positive airway pressure." With CPAP, the amount of pressure stays the same while you breathe in (inhale) and out (exhale). BIPAP stands for "bi-level positive airway pressure." With BIPAP, the amount of pressure will be higher when you inhale and lower when you exhale. This allows you to take larger breaths. CPAP or BIPAP may be used in the hospital, or your health care provider may want you to use it at home. You may need to have a sleep study before your health care provider can order a machine for you to use at home. What are the advantages? CPAP or BIPAP can be helpful if you have: Sleep apnea. Chronic obstructive pulmonary disease (COPD). Heart failure. Medical conditions that cause muscle weakness, including muscular dystrophy or amyotrophic lateral sclerosis (ALS). Other problems that cause breathing to be shallow, weak, abnormal, or difficult. CPAP and BIPAP are most commonly used for obstructive sleep apnea (OSA) to keep the airways from collapsing when the muscles relax during sleep. What are the risks? Generally, this is a safe treatment. However, problems may occur, including: Irritated skin or skin sores if the mask does not fit properly. Dry or  stuffy nose or nosebleeds. Dry mouth. Feeling gassy or bloated. Sinus or lung infection if the equipment is not cleaned properly. When should CPAP or BIPAP be used? In most cases, the mask only needs to be worn during sleep. Generally, the mask needs to be worn throughout the night and during any daytime naps. People with certain medical conditions may also need to wear the mask at other times, such as when they are awake. Follow instructions from your health care provider about when to use the machine. What happens during CPAP or BIPAP?  Both CPAP and BIPAP are provided by a small machine with a flexible plastic tube that attaches to a plastic mask that you wear. Air is blown through the mask into your nose or mouth. The amount of pressure that is used to blow the air can be adjusted on the machine. Your health care provider will set the pressure setting and help you find the best mask for you. Tips for using the mask Because the mask needs to be snug, some people feel trapped or closed-in (claustrophobic) when first using the mask. If you feel this way, you may need to get used to the mask. One way to do this is to hold the mask loosely over your nose or mouth and then gradually apply the mask more snugly. You can also gradually increase the amount of time that you use the mask. Masks are available in various types and sizes. If your mask does not fit well, talk with your health care provider about getting a different one. Some common types  of masks include: Full face masks, which fit over the mouth and nose. Nasal masks, which fit over the nose. Nasal pillow or prong masks, which fit into the nostrils. If you are using a mask that fits over your nose and you tend to breathe through your mouth, a chin strap may be applied to help keep your mouth closed. Use a skin barrier to protect your skin as told by your health care provider. Some CPAP and BIPAP machines have alarms that may sound if the mask  comes off or develops a leak. If you have trouble with the mask, it is very important that you talk with your health care provider about finding a way to make the mask easier to tolerate. Do not stop using the mask. There could be a negative impact on your health if you stop using the mask. Tips for using the machine Place your CPAP or BIPAP machine on a secure table or stand near an electrical outlet. Know where the on/off switch is on the machine. Follow instructions from your health care provider about how to set the pressure on your machine and when you should use it. Do not eat or drink while the CPAP or BIPAP machine is on. Food or fluids could get pushed into your lungs by the pressure of the CPAP or BIPAP. For home use, CPAP and BIPAP machines can be rented or purchased through home health care companies. Many different brands of machines are available. Renting a machine before purchasing may help you find out which particular machine works well for you. Your health insurance company may also decide which machine you may get. Keep the CPAP or BIPAP machine and attachments clean. Ask your health care provider for specific instructions. Check the humidifier if you have a dry stuffy nose or nosebleeds. Make sure it is working correctly. Follow these instructions at home: Take over-the-counter and prescription medicines only as told by your health care provider. Ask if you can take sinus medicine if your sinuses are blocked. Do not use any products that contain nicotine or tobacco. These products include cigarettes, chewing tobacco, and vaping devices, such as e-cigarettes. If you need help quitting, ask your health care provider. Keep all follow-up visits. This is important. Contact a health care provider if: You have redness or pressure sores on your head, face, mouth, or nose from the mask or head gear. You have trouble using the CPAP or BIPAP machine. You cannot tolerate wearing the CPAP or  BIPAP mask. Someone tells you that you snore even when wearing your CPAP or BIPAP. Get help right away if: You have trouble breathing. You feel confused. Summary CPAP and BIPAP are methods that use air pressure to keep your airways open and to help you breathe well. If you have trouble with the mask, it is very important that you talk with your health care provider about finding a way to make the mask easier to tolerate. Do not stop using the mask. There could be a negative impact to your health if you stop using the mask. Follow instructions from your health care provider about when to use the machine. This information is not intended to replace advice given to you by your health care provider. Make sure you discuss any questions you have with your health care provider. Document Revised: 06/05/2021 Document Reviewed: 10/05/2020 Elsevier Patient Education  2023 [employer].

## 2022-06-02 NOTE — Telephone Encounter (Signed)
Called [facility] and left voicemail for home sleep study for patient and that if they do have it on file to fax over to [place] [phone].  Will wait for fax

## 2022-06-04 NOTE — Telephone Encounter (Signed)
Sleep study received from [facility], a copy was put on Colette's desk and one copy put in Beth's cabinet.  Nothing further needed.  Called and spoke with Jasmine December with [facility] regarding HST results.  She stated she was faxing them over now to [phone].

## 2022-06-09 DIAGNOSIS — Z1211 Encounter for screening for malignant neoplasm of colon: Secondary | ICD-10-CM | POA: Diagnosis not present

## 2022-06-09 DIAGNOSIS — Z800 Family history of malignant neoplasm of digestive organs: Secondary | ICD-10-CM | POA: Diagnosis not present

## 2022-06-12 NOTE — Progress Notes (Signed)
Reviewed and agree with assessment/plan.   Li Fragoso, MD [facility] 06/12/2022, 9:52 AM Pager:  [phone]  

## 2022-06-25 ENCOUNTER — Ambulatory Visit
Admission: RE | Admit: 2022-06-25 | Discharge: 2022-06-25 | Disposition: A | Discharge status: Home / Self Care | Payer: BC Managed Care – PPO | Source: Ambulatory Visit | Attending: Internal Medicine | Admitting: Internal Medicine

## 2022-06-25 DIAGNOSIS — R911 Solitary pulmonary nodule: Secondary | ICD-10-CM | POA: Diagnosis not present

## 2022-06-25 DIAGNOSIS — R918 Other nonspecific abnormal finding of lung field: Secondary | ICD-10-CM | POA: Diagnosis not present

## 2022-06-27 ENCOUNTER — Telehealth: Payer: Self-pay | Admitting: Primary Care

## 2022-06-30 NOTE — Telephone Encounter (Signed)
Spoke to Howard at [employer].  She states pt is in the ones she is working on now.  She will give him a call now and let him know she will calling him back tomorrow to schedule.  Nothing further needed.

## 2022-06-30 NOTE — Telephone Encounter (Signed)
Order was placed after pt's visit with BW 7/21 for pt to receive a new CPAP machine. Message from 7/26 states that pt's sleep study was received and copy was given to [facility].  Routing to [facility] for help with this. Please advise.

## 2022-06-30 NOTE — Telephone Encounter (Signed)
Called [facility] and was transferred to [facility] to check status.  I had to leave a vm for her to call me back.

## 2022-07-07 DIAGNOSIS — G4733 Obstructive sleep apnea (adult) (pediatric): Secondary | ICD-10-CM | POA: Diagnosis not present

## 2022-07-11 ENCOUNTER — Ambulatory Visit: Payer: BC Managed Care – PPO | Admitting: Pulmonary Disease

## 2022-07-11 ENCOUNTER — Encounter: Payer: Self-pay | Admitting: Pulmonary Disease

## 2022-07-11 VITALS — BP 120/90 | HR 75 | Ht 76.0 in | Wt 252.0 lb

## 2022-07-11 DIAGNOSIS — Z9989 Dependence on other enabling machines and devices: Secondary | ICD-10-CM

## 2022-07-11 DIAGNOSIS — G4733 Obstructive sleep apnea (adult) (pediatric): Secondary | ICD-10-CM | POA: Diagnosis not present

## 2022-07-11 DIAGNOSIS — R918 Other nonspecific abnormal finding of lung field: Secondary | ICD-10-CM

## 2022-07-11 NOTE — Progress Notes (Signed)
Synopsis: Referred in September 2023 for multiple pulmonary nodules by Merri Brunette, MD  Subjective:   PATIENT ID: Thomas [facility]row GENDER: male DOB: 05-05-1961, MRN: [id number]  Chief Complaint  Patient presents with   Consult    Lung nodule    This is a 61 year old gentleman who had a coronary cardiac CT last year with recommended follow-up after finding incidental pulmonary nodules.  He has a history of sleep apnea.  He is a lifelong non-smoker.  Does have secondhand smoke exposure.  He works as an Art gallery manager.  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  No significant dust or chemical exposures.  He had a follow-up CT scan approximately 1 year later which was in August 2023.  This revealed a 6 mm right upper lobe pulmonary nodule 5 mm right lower lobe pulmonary nodule 4 mm fissural nodule right middle lobe, 4 mm right middle lobe nodule and scattered other smaller 3 mm densities.    Past Medical History:  Diagnosis Date   Allergy    Second hand smoke exposure    Sleep apnea      History reviewed. No pertinent family history.   History reviewed. No pertinent surgical history.  Social History   Socioeconomic History   Marital status: Married    Spouse name: Not on file   Number of children: Not on file   Years of education: Not on file   Highest education level: Not on file  Occupational History   Not on file  Tobacco Use   Smoking status: Never    Passive exposure: Past   Smokeless tobacco: Never  Substance and Sexual Activity   Alcohol use: Not on file   Drug use: Not on file   Sexual activity: Not on file  Other Topics Concern   Not on file  Social History Narrative   Not on file   Social Determinants of Health   Financial Resource Strain: Not on file  Food Insecurity: Not on file  Transportation Needs: Not on file  Physical Activity: Not on file  Stress: Not on file  Social Connections: Not on file  Intimate Partner Violence: Not on  file     Allergies  Allergen Reactions   Simvastatin Other (See Comments)     Outpatient Medications Prior to Visit  Medication Sig Dispense Refill   DYMISTA 137-50 MCG/ACT SUSP Place 1 spray into both nostrils 2 (two) times daily.  12   fluticasone (FLONASE) 50 MCG/ACT nasal spray      loratadine-pseudoephedrine (CLARITIN-D 12 HOUR) 5-120 MG tablet 1 tablet as needed     Alirocumab (PRALUENT) 75 MG/ML SOAJ Inject 75 mg subcutaneous every 14 days, Replaces Repatha 2 mL 11   aspirin EC 81 MG tablet 1 tablet     Evolocumab (REPATHA SURECLICK) 140 MG/ML SOAJ Inject 140 mg under the skin every 14 days. (Patient not taking: Reported on 05/30/2022) 2 mL 11   omega-3 acid ethyl esters (LOVAZA) 1 G capsule      No facility-administered medications prior to visit.    Review of Systems  Constitutional:  Negative for chills, fever, malaise/fatigue and weight loss.  HENT:  Negative for hearing loss, sore throat and tinnitus.   Eyes:  Negative for blurred vision and double vision.  Respiratory:  Negative for cough, hemoptysis, sputum production, shortness of breath, wheezing and stridor.   Cardiovascular:  Negative for chest pain, palpitations, orthopnea, leg swelling and PND.  Gastrointestinal:  Negative for abdominal  pain, constipation, diarrhea, heartburn, nausea and vomiting.  Genitourinary:  Negative for dysuria, hematuria and urgency.  Musculoskeletal:  Negative for joint pain and myalgias.  Skin:  Negative for itching and rash.  Neurological:  Negative for dizziness, tingling, weakness and headaches.  Endo/Heme/Allergies:  Negative for environmental allergies. Does not bruise/bleed easily.  Psychiatric/Behavioral:  Negative for depression. The patient is not nervous/anxious and does not have insomnia.   All other systems reviewed and are negative.    Objective:  Physical Exam Vitals reviewed.  Constitutional:      General: He is not in acute distress.    Appearance: He is  well-developed.  HENT:     Head: Normocephalic and atraumatic.  Eyes:     General: No scleral icterus.    Conjunctiva/sclera: Conjunctivae normal.     Pupils: Pupils are equal, round, and reactive to light.  Neck:     Vascular: No JVD.     Trachea: No tracheal deviation.  Cardiovascular:     Rate and Rhythm: Normal rate and regular rhythm.     Heart sounds: Normal heart sounds. No murmur heard. Pulmonary:     Effort: Pulmonary effort is normal. No tachypnea, accessory muscle usage or respiratory distress.     Breath sounds: No stridor. No wheezing, rhonchi or rales.  Abdominal:     General: There is no distension.     Palpations: Abdomen is soft.     Tenderness: There is no abdominal tenderness.  Musculoskeletal:        General: No tenderness.     Cervical back: Neck supple.  Lymphadenopathy:     Cervical: No cervical adenopathy.  Skin:    General: Skin is warm and dry.     Capillary Refill: Capillary refill takes less than 2 seconds.     Findings: No rash.  Neurological:     Mental Status: He is alert and oriented to person, place, and time.  Psychiatric:        Behavior: Behavior normal.      Vitals:   07/11/22 1023  BP: (!) 120/90  Pulse: 75  SpO2: 95%  Weight: 252 lb (114.3 kg)  Height: 6\' 4"  (1.93 m)   95% on RA BMI Readings from Last 3 Encounters:  07/11/22 30.67 kg/m  05/30/22 30.84 kg/m  01/22/15 28.24 kg/m   Wt Readings from Last 3 Encounters:  07/11/22 252 lb (114.3 kg)  05/30/22 253 lb 6.4 oz (114.9 kg)  01/22/15 232 lb (105.2 kg)     CBC No results found for: "WBC", "RBC", "HGB", "HCT", "PLT", "MCV", "MCH", "MCHC", "RDW", "LYMPHSABS", "MONOABS", "EOSABS", "BASOSABS"    Chest Imaging: CT chest 06/25/2022: Multiple pulmonary nodules all less than 6 mm in size. The patient's images have been independently reviewed by me.    Pulmonary Functions Testing Results:     No data to display          FeNO:   Pathology:    Echocardiogram:   Heart Catheterization:     Assessment & Plan:     ICD-10-CM   1. Multiple lung nodules  R91.8 CT CHEST WO CONTRAST    2. OSA on CPAP  G47.33    Z99.89       Discussion:  This is a 61 year old gentleman, multiple pulmonary nodules, non-smoker, secondhand smoke exposure.  Otherwise low risk.  Plan: We talked about the utility of nodule follow-up. There is still a small chance of malignancy and subcentimeter nodules in non-smokers. He is however low risk  and I did explain that multiple pulmonary nodules is less likely to be malignancy. We will repeat her noncontrasted CT chest in 24 months. Patient can follow-up with [place] in 2 years after repeat scan.     Current Outpatient Medications:    DYMISTA 137-50 MCG/ACT SUSP, Place 1 spray into both nostrils 2 (two) times daily., Disp: , Rfl: 12   fluticasone (FLONASE) 50 MCG/ACT nasal spray, , Disp: , Rfl:    loratadine-pseudoephedrine (CLARITIN-D 12 HOUR) 5-120 MG tablet, 1 tablet as needed, Disp: , Rfl:    Alirocumab (PRALUENT) 75 MG/ML SOAJ, Inject 75 mg subcutaneous every 14 days, Replaces Repatha, Disp: 2 mL, Rfl: 11   aspirin EC 81 MG tablet, 1 tablet, Disp: , Rfl:    Evolocumab (REPATHA SURECLICK) 140 MG/ML SOAJ, Inject 140 mg under the skin every 14 days. (Patient not taking: Reported on 05/30/2022), Disp: 2 mL, Rfl: 11   omega-3 acid ethyl esters (LOVAZA) 1 G capsule, , Disp: , Rfl:     Josephine Igo, DO [facility] 07/11/2022 10:47 AM

## 2022-07-11 NOTE — Patient Instructions (Signed)
Thank you for visiting Dr. Tonia Brooms at [facility]. Today we recommend the following:  Orders Placed This Encounter  Procedures   CT CHEST WO CONTRAST   Follow up after ct chest  Return in about 2 years (around 07/11/2024) for with Kandice Robinsons, NP, or Dr. Tonia Brooms.    Please do your part to reduce the spread of COVID-19.

## 2022-07-24 ENCOUNTER — Other Ambulatory Visit (HOSPITAL_COMMUNITY): Payer: Self-pay

## 2022-07-25 ENCOUNTER — Other Ambulatory Visit (HOSPITAL_COMMUNITY): Payer: Self-pay

## 2022-07-25 DIAGNOSIS — I2510 Atherosclerotic heart disease of native coronary artery without angina pectoris: Secondary | ICD-10-CM | POA: Diagnosis not present

## 2022-07-25 DIAGNOSIS — B070 Plantar wart: Secondary | ICD-10-CM | POA: Diagnosis not present

## 2022-07-25 DIAGNOSIS — R918 Other nonspecific abnormal finding of lung field: Secondary | ICD-10-CM | POA: Diagnosis not present

## 2022-07-28 ENCOUNTER — Other Ambulatory Visit (HOSPITAL_COMMUNITY): Payer: Self-pay

## 2022-07-30 ENCOUNTER — Other Ambulatory Visit (HOSPITAL_COMMUNITY): Payer: Self-pay

## 2022-08-13 ENCOUNTER — Other Ambulatory Visit (HOSPITAL_COMMUNITY): Payer: Self-pay

## 2022-08-13 MED ORDER — PRALUENT 75 MG/ML ~~LOC~~ SOAJ
SUBCUTANEOUS | 11 refills | Status: AC
  Filled 2022-08-13 – 2022-10-07 (×3): qty 2, 28d supply, fill #0
  Filled 2022-11-27: qty 2, 28d supply, fill #1
  Filled 2023-02-04: qty 2, 28d supply, fill #2
  Filled 2023-04-14: qty 2, 28d supply, fill #3

## 2022-08-28 ENCOUNTER — Other Ambulatory Visit (HOSPITAL_COMMUNITY): Payer: Self-pay

## 2022-08-29 ENCOUNTER — Other Ambulatory Visit (HOSPITAL_COMMUNITY): Payer: Self-pay

## 2022-09-01 ENCOUNTER — Other Ambulatory Visit (HOSPITAL_COMMUNITY): Payer: Self-pay

## 2022-09-02 ENCOUNTER — Other Ambulatory Visit (HOSPITAL_COMMUNITY): Payer: Self-pay

## 2022-09-03 ENCOUNTER — Other Ambulatory Visit (HOSPITAL_COMMUNITY): Payer: Self-pay

## 2022-09-04 ENCOUNTER — Other Ambulatory Visit (HOSPITAL_COMMUNITY): Payer: Self-pay

## 2022-09-05 ENCOUNTER — Other Ambulatory Visit (HOSPITAL_COMMUNITY): Payer: Self-pay

## 2022-09-10 ENCOUNTER — Other Ambulatory Visit (HOSPITAL_COMMUNITY): Payer: Self-pay

## 2022-09-12 ENCOUNTER — Other Ambulatory Visit (HOSPITAL_COMMUNITY): Payer: Self-pay

## 2022-09-15 ENCOUNTER — Other Ambulatory Visit (HOSPITAL_COMMUNITY): Payer: Self-pay

## 2022-09-16 ENCOUNTER — Other Ambulatory Visit (HOSPITAL_COMMUNITY): Payer: Self-pay

## 2022-09-18 ENCOUNTER — Other Ambulatory Visit (HOSPITAL_COMMUNITY): Payer: Self-pay

## 2022-09-23 DIAGNOSIS — H43813 Vitreous degeneration, bilateral: Secondary | ICD-10-CM | POA: Diagnosis not present

## 2022-09-23 DIAGNOSIS — H43393 Other vitreous opacities, bilateral: Secondary | ICD-10-CM | POA: Diagnosis not present

## 2022-09-23 DIAGNOSIS — H5213 Myopia, bilateral: Secondary | ICD-10-CM | POA: Diagnosis not present

## 2022-10-07 ENCOUNTER — Other Ambulatory Visit (HOSPITAL_COMMUNITY): Payer: Self-pay

## 2022-11-27 ENCOUNTER — Other Ambulatory Visit (HOSPITAL_COMMUNITY): Payer: Self-pay

## 2022-12-15 DIAGNOSIS — Z1283 Encounter for screening for malignant neoplasm of skin: Secondary | ICD-10-CM | POA: Diagnosis not present

## 2022-12-15 DIAGNOSIS — L308 Other specified dermatitis: Secondary | ICD-10-CM | POA: Diagnosis not present

## 2022-12-15 DIAGNOSIS — X32XXXD Exposure to sunlight, subsequent encounter: Secondary | ICD-10-CM | POA: Diagnosis not present

## 2022-12-15 DIAGNOSIS — D225 Melanocytic nevi of trunk: Secondary | ICD-10-CM | POA: Diagnosis not present

## 2022-12-15 DIAGNOSIS — L570 Actinic keratosis: Secondary | ICD-10-CM | POA: Diagnosis not present

## 2022-12-16 ENCOUNTER — Other Ambulatory Visit: Payer: Self-pay

## 2022-12-17 ENCOUNTER — Other Ambulatory Visit: Payer: Self-pay

## 2023-02-04 ENCOUNTER — Other Ambulatory Visit (HOSPITAL_COMMUNITY): Payer: Self-pay

## 2023-02-16 ENCOUNTER — Other Ambulatory Visit: Payer: Self-pay

## 2023-04-14 ENCOUNTER — Other Ambulatory Visit (HOSPITAL_COMMUNITY): Payer: Self-pay

## 2023-04-17 ENCOUNTER — Other Ambulatory Visit (HOSPITAL_COMMUNITY): Payer: Self-pay

## 2023-04-24 DIAGNOSIS — Z0000 Encounter for general adult medical examination without abnormal findings: Secondary | ICD-10-CM | POA: Diagnosis not present

## 2023-04-24 DIAGNOSIS — R7309 Other abnormal glucose: Secondary | ICD-10-CM | POA: Diagnosis not present

## 2023-04-24 DIAGNOSIS — Z125 Encounter for screening for malignant neoplasm of prostate: Secondary | ICD-10-CM | POA: Diagnosis not present

## 2023-04-29 DIAGNOSIS — I2510 Atherosclerotic heart disease of native coronary artery without angina pectoris: Secondary | ICD-10-CM | POA: Diagnosis not present

## 2023-04-29 DIAGNOSIS — Z1212 Encounter for screening for malignant neoplasm of rectum: Secondary | ICD-10-CM | POA: Diagnosis not present

## 2023-04-29 DIAGNOSIS — Z0000 Encounter for general adult medical examination without abnormal findings: Secondary | ICD-10-CM | POA: Diagnosis not present

## 2023-04-29 DIAGNOSIS — R911 Solitary pulmonary nodule: Secondary | ICD-10-CM | POA: Diagnosis not present

## 2023-04-29 DIAGNOSIS — E785 Hyperlipidemia, unspecified: Secondary | ICD-10-CM | POA: Diagnosis not present

## 2023-04-29 DIAGNOSIS — G4733 Obstructive sleep apnea (adult) (pediatric): Secondary | ICD-10-CM | POA: Diagnosis not present

## 2023-05-29 DIAGNOSIS — Z0000 Encounter for general adult medical examination without abnormal findings: Secondary | ICD-10-CM | POA: Diagnosis not present

## 2023-06-03 ENCOUNTER — Other Ambulatory Visit: Payer: Self-pay

## 2023-06-03 ENCOUNTER — Other Ambulatory Visit (HOSPITAL_COMMUNITY): Payer: Self-pay

## 2023-06-03 MED ORDER — PRALUENT 75 MG/ML ~~LOC~~ SOAJ: SUBCUTANEOUS | 1 refills | Status: AC

## 2023-06-04 ENCOUNTER — Other Ambulatory Visit: Payer: Self-pay

## 2023-06-04 ENCOUNTER — Other Ambulatory Visit (HOSPITAL_COMMUNITY): Payer: Self-pay

## 2023-06-04 MED ORDER — PRALUENT 75 MG/ML ~~LOC~~ SOAJ: SUBCUTANEOUS | 3 refills | Status: AC

## 2023-06-08 ENCOUNTER — Other Ambulatory Visit: Payer: Self-pay

## 2023-06-09 ENCOUNTER — Other Ambulatory Visit (HOSPITAL_COMMUNITY): Payer: Self-pay

## 2023-06-09 ENCOUNTER — Other Ambulatory Visit: Payer: Self-pay

## 2023-06-10 ENCOUNTER — Other Ambulatory Visit (HOSPITAL_COMMUNITY): Payer: Self-pay

## 2023-06-10 ENCOUNTER — Other Ambulatory Visit: Payer: Self-pay

## 2023-06-10 MED ORDER — PRALUENT 75 MG/ML ~~LOC~~ SOAJ
SUBCUTANEOUS | 0 refills | Status: DC
  Filled 2023-06-10: qty 2, 60d supply, fill #0

## 2023-06-10 MED ORDER — PRALUENT 75 MG/ML ~~LOC~~ SOAJ
SUBCUTANEOUS | 3 refills | Status: AC
  Filled 2023-06-10: qty 2, 28d supply, fill #0
  Filled 2023-07-09: qty 2, 28d supply, fill #1
  Filled 2023-08-19 – 2023-09-15 (×2): qty 2, 28d supply, fill #2
  Filled 2023-12-11: qty 2, 28d supply, fill #3

## 2023-06-11 ENCOUNTER — Other Ambulatory Visit: Payer: Self-pay

## 2023-06-12 ENCOUNTER — Other Ambulatory Visit: Payer: Self-pay

## 2023-06-16 ENCOUNTER — Encounter: Payer: Self-pay | Admitting: Allergy and Immunology

## 2023-06-16 ENCOUNTER — Other Ambulatory Visit: Payer: Self-pay

## 2023-06-16 ENCOUNTER — Ambulatory Visit (INDEPENDENT_AMBULATORY_CARE_PROVIDER_SITE_OTHER): Payer: BC Managed Care – PPO | Admitting: Allergy and Immunology

## 2023-06-16 VITALS — BP 142/80 | HR 82 | Temp 98.4°F | Resp 18 | Ht 76.0 in | Wt 256.5 lb

## 2023-06-16 DIAGNOSIS — J3089 Other allergic rhinitis: Secondary | ICD-10-CM | POA: Diagnosis not present

## 2023-06-16 DIAGNOSIS — G43909 Migraine, unspecified, not intractable, without status migrainosus: Secondary | ICD-10-CM

## 2023-06-16 MED ORDER — CETIRIZINE HCL 10 MG PO TABS: 10.0000 mg | ORAL_TABLET | Freq: Every day | ORAL | 5 refills | Status: DC

## 2023-06-16 MED ORDER — MONTELUKAST SODIUM 10 MG PO TABS: 10.0000 mg | ORAL_TABLET | Freq: Every day | ORAL | 5 refills | Status: DC

## 2023-06-16 MED ORDER — AZELASTINE-FLUTICASONE 137-50 MCG/ACT NA SUSP: NASAL | 5 refills | Status: DC

## 2023-06-16 NOTE — Progress Notes (Unsigned)
[place] - [place] - [place] - [place] - [place]   Dear Renne Morrow,  Thank you for referring Thomas [facility]row to the [facility] [place] on 06/16/2023.   Below is a summation of this patient's evaluation and recommendations.  Thank you for your referral. I will keep you informed about this patient's response to treatment.   If you have any questions please do not hesitate to contact me.   Sincerely,  Thomas Priest, MD Allergy / Immunology [facility]   ______________________________________________________________________    NEW PATIENT NOTE  Referring Provider: Merri Brunette, MD Primary Provider: Merri Brunette, MD Date of office visit: 06/16/2023    Subjective:   Chief Complaint:  Thomas Morrow (DOB: Oct 31, 1961) is a 62 y.o. male who presents to the clinic on 06/16/2023 with a chief complaint of Allergic Rhinitis , Sinus Problem, Establish Care, and Eczema .     HPI: Thomas Morrow prsents to this clinic in evaluation of allergies and sinus problems.  He has a long history of upper airway issues dating back to around the age of 16.  He has lots of nasal congestion and some intermittent sneezing and some ear fullness and some intermittent inability to smell and this appears to be precipitated by exposure to dust or pollen.  When he mows the grass he might use a an N95 mask otherwise he will end up getting extremely stuffy within days.  He is using Dymista and antihistamines which definitely does help this issue and helps prevent all of his issues as he moves forward through each season of the year.  Yet he still remains symptomatic even in the face of this therapy.  And then he developed headaches.  He has a frontal and supraorbital and retroorbital headache that is unassociated with any dizziness or vision changes and can sometimes be quite significant for which she will take a Claritin-D and a  Tylenol.  Using these headaches last approximately 1 week.  Triggers for these headaches include getting his nose all stuffed up and eating black pepper.  Sometimes he will get antibiotics for these headaches although over the course of the past 5 years these headaches have modified and he has not required any antibiotics.  If he eats a lot of chocolate he then developed some issues with leg itching so thus he stays away from chocolate consumption.  He never has any associated systemic or constitutional symptoms associated with this issue.  Past Medical History:  Diagnosis Date   Allergy    Eczema    Second hand smoke exposure    Sleep apnea     Past Surgical History:  Procedure Laterality Date   Appendicitis  2000   Tonsilltis  1965    Allergies as of 06/16/2023       Reactions   Simvastatin Other (See Comments)        Medication List    aspirin EC 81 MG tablet 1 tablet   augmented betamethasone dipropionate 0.05 % cream Commonly known as: DIPROLENE-AF Apply topically daily.   Claritin-D 12 Hour 5-120 MG tablet Generic drug: loratadine-pseudoephedrine 1 tablet as needed   [employer] Athletes Foot 1 % cream Generic drug: terbinafine Apply 1 Application topically daily.   Dymista 137-50 MCG/ACT Susp Generic drug: Azelastine-Fluticasone Place 1 spray into both nostrils 2 (two) times daily.   fluticasone 0.05 % cream Commonly known as: CUTIVATE Apply 1 Application topically 2 (two) times daily.  fluticasone 50 MCG/ACT nasal spray Commonly known as: FLONASE   omega-3 acid ethyl esters 1 g capsule Commonly known as: LOVAZA   Praluent 75 MG/ML Soaj Generic drug: Alirocumab Inject 75 mg subcutaneous every 14 days, Replaces Repatha   Praluent 75 MG/ML Soaj Generic drug: Alirocumab Inject 75 mg subcutaneous every 14 days, Replaces Repatha 90 days   Praluent 75 MG/ML Soaj Generic drug: Alirocumab Inject 75 mg subcutaneous every 14 days Subcutaneous 90 days    Praluent 75 MG/ML Soaj Generic drug: Alirocumab Inject 75 mg subcutaneous every month. Subcutaneous once a month 90 days   Repatha SureClick 140 MG/ML Soaj Generic drug: Evolocumab Inject 140 mg under the skin every 14 days.    Review of systems negative except as noted in HPI / PMHx or noted below:  Review of Systems  Constitutional: Negative.   HENT: Negative.    Eyes: Negative.   Respiratory: Negative.    Cardiovascular: Negative.   Gastrointestinal: Negative.   Genitourinary: Negative.   Musculoskeletal: Negative.   Skin: Negative.   Neurological: Negative.   Endo/Heme/Allergies: Negative.   Psychiatric/Behavioral: Negative.      History reviewed. No pertinent family history.  Social History   Socioeconomic History   Marital status: Married    Spouse name: Not on file   Number of children: Not on file   Years of education: Not on file   Highest education level: Not on file  Occupational History   Not on file  Tobacco Use   Smoking status: Never    Passive exposure: Past   Smokeless tobacco: Never  Vaping Use   Vaping status: Never Used  Substance and Sexual Activity   Alcohol use: Yes    Alcohol/week: 3.0 standard drinks of alcohol    Types: 3 Cans of beer per week   Drug use: Never   Sexual activity: Not on file  Other Topics Concern   Not on file  Social History Narrative   Not on file   Environmental and Social history  Lives in a house with a dry environment, no animals located inside the household, no carpet in the bedroom, no plastic on the bed, no plastic on the pillow, no smoking ongoing inside the household.  He works as a Nurse, learning disability for a [employer]  Objective:   Vitals:   06/16/23 0918  BP: (!) 142/80  Pulse: 82  Resp: 18  Temp: 98.4 F (36.9 C)  SpO2: 95%   Height: 6\' 4"  (193 cm) Weight: 256 lb 8 oz (116.3 kg)  Physical Exam Constitutional:      Appearance: He is not diaphoretic.  HENT:     Head: Normocephalic.      Right Ear: Tympanic membrane, ear canal and external ear normal.     Left Ear: Tympanic membrane, ear canal and external ear normal.     Nose: Nose normal. No mucosal edema or rhinorrhea.     Mouth/Throat:     Pharynx: Uvula midline. No oropharyngeal exudate.  Eyes:     Conjunctiva/sclera: Conjunctivae normal.  Neck:     Thyroid: No thyromegaly.     Trachea: Trachea normal. No tracheal tenderness or tracheal deviation.  Cardiovascular:     Rate and Rhythm: Normal rate and regular rhythm.     Heart sounds: Normal heart sounds, S1 normal and S2 normal. No murmur heard. Pulmonary:     Effort: No respiratory distress.     Breath sounds: Normal breath sounds. No stridor. No wheezing or rales.  Lymphadenopathy:     Head:     Right side of head: No tonsillar adenopathy.     Left side of head: No tonsillar adenopathy.     Cervical: No cervical adenopathy.  Skin:    Findings: No erythema or rash.     Nails: There is no clubbing.  Neurological:     Mental Status: He is alert.     Diagnostics: Allergy skin tests were performed.   Spirometry was performed and demonstrated an FEV1 of *** @ *** % of predicted. FEV1/FVC = ***  The patient had an Asthma Control Test with the following results:  .     Assessment and Plan:    No diagnosis found.  There are no Patient Instructions on file for this visit.   Thomas Priest, MD Allergy / Immunology [facility] [place]

## 2023-06-16 NOTE — Patient Instructions (Signed)
  1. Allergen avoidance measures  2. Treat and prevent inflammation of airway:   A. Dymista -1 spray each nostril 1-2 times per day  B. Montelukast 10 mg - 1 tablet 1 time per day  3. If needed:   A. Antihistamine  B. Nasal saline  4. Consider a course of immunotherapy  5. Return to clinic in 4 weeks or earlier if problem

## 2023-06-20 ENCOUNTER — Other Ambulatory Visit (HOSPITAL_COMMUNITY): Payer: Self-pay

## 2023-07-09 ENCOUNTER — Other Ambulatory Visit (HOSPITAL_COMMUNITY): Payer: Self-pay

## 2023-07-14 ENCOUNTER — Ambulatory Visit: Payer: Self-pay | Admitting: Allergy and Immunology

## 2023-07-27 ENCOUNTER — Other Ambulatory Visit: Payer: Self-pay

## 2023-08-01 ENCOUNTER — Encounter (HOSPITAL_COMMUNITY): Payer: Self-pay

## 2023-08-04 ENCOUNTER — Ambulatory Visit: Payer: Self-pay | Admitting: Allergy and Immunology

## 2023-08-19 ENCOUNTER — Other Ambulatory Visit: Payer: Self-pay

## 2023-08-20 ENCOUNTER — Other Ambulatory Visit: Payer: Self-pay

## 2023-08-20 NOTE — Progress Notes (Signed)
Patient completed assessment with me, and then realized he had at least one dose on hand. Spoke with patient again, and he confirmed he had at least one dose. Pt receives 2 pens as a 28 day supply because we cannot break boxes, but only injects monthly. Changed recurrence to 56 days, but timed next call to 11/6, to confirm whether or not he has a dose for November. If patient has November dose, retime to December.

## 2023-09-01 ENCOUNTER — Encounter: Payer: Self-pay | Admitting: Allergy and Immunology

## 2023-09-01 ENCOUNTER — Ambulatory Visit: Payer: BC Managed Care – PPO | Admitting: Allergy and Immunology

## 2023-09-01 ENCOUNTER — Other Ambulatory Visit: Payer: Self-pay

## 2023-09-01 VITALS — BP 136/86 | HR 90 | Temp 97.7°F | Resp 18 | Ht 76.0 in | Wt 260.3 lb

## 2023-09-01 DIAGNOSIS — J3089 Other allergic rhinitis: Secondary | ICD-10-CM | POA: Diagnosis not present

## 2023-09-01 DIAGNOSIS — G43909 Migraine, unspecified, not intractable, without status migrainosus: Secondary | ICD-10-CM | POA: Diagnosis not present

## 2023-09-01 NOTE — Progress Notes (Unsigned)
[place] - [place] - [place] - [place] - [place]   Follow-up Note  Referring Provider: Merri Brunette, MD Primary Provider: Merri Brunette, MD Date of Office Visit: 09/01/2023  Subjective:   Thomas [facility] (DOB: 1961-09-13) is a 62 y.o. male who [facility] on 09/01/2023 in re-evaluation of the following:  HPI: Thomas [facility]row returns to the clinic in evaluation of allergic rhinitis and "sinus" headache.  I last saw him in this clinic 16 June 2023.  He has performed house dust avoidance measures and he is using his Dymista and he thinks he is doing relatively well.  He certainly is improved from his first visit.  He still continues to get these dull headaches that he calls "sinus" usually in the frontal region and sometimes supraorbital but they seem to be less intense at this point.  His insurance company will not pay for Dymista.  Allergies as of 09/01/2023       Reactions   Chocolate    Other Reaction(s): itching if too much   Simvastatin Other (See Comments)        Medication List    aspirin EC 81 MG tablet 1 tablet   augmented betamethasone dipropionate 0.05 % cream Commonly known as: DIPROLENE-AF Apply topically daily.   Azelastine-Fluticasone 137-50 MCG/ACT Susp Commonly known as: Dymista 1 spray each nostril 1-2 times per day   cetirizine 10 MG tablet Commonly known as: ZYRTEC Take 1 tablet (10 mg total) by mouth daily.   Claritin-D 12 Hour 5-120 MG tablet Generic drug: loratadine-pseudoephedrine 1 tablet as needed   [employer] Athletes Foot 1 % cream Generic drug: terbinafine Apply 1 Application topically daily.   fluticasone 0.05 % cream Commonly known as: CUTIVATE Apply 1 Application topically 2 (two) times daily.   omega-3 acid ethyl esters 1 g capsule Commonly known as: LOVAZA   Praluent 75 MG/ML Soaj Generic drug: Alirocumab Inject 75 mg subcutaneous every 14 days, Replaces Repatha   Praluent 75 MG/ML Soaj Generic  drug: Alirocumab Inject 75 mg subcutaneous every 14 days, Replaces Repatha 90 days   Praluent 75 MG/ML Soaj Generic drug: Alirocumab Inject 75 mg subcutaneous every 14 days Subcutaneous 90 days   Praluent 75 MG/ML Soaj Generic drug: Alirocumab Inject 75 mg subcutaneous every month. Subcutaneous once a month 90 days   Repatha SureClick 140 MG/ML Soaj Generic drug: Evolocumab Inject 140 mg under the skin every 14 days.    Past Medical History:  Diagnosis Date   Allergy    Eczema    Second hand smoke exposure    Sleep apnea     Past Surgical History:  Procedure Laterality Date   Appendicitis  2000   Tonsilltis  1965    Review of systems negative except as noted in HPI / PMHx or noted below:  Review of Systems  Constitutional: Negative.   HENT: Negative.    Eyes: Negative.   Respiratory: Negative.    Cardiovascular: Negative.   Gastrointestinal: Negative.   Genitourinary: Negative.   Musculoskeletal: Negative.   Skin: Negative.   Neurological: Negative.   Endo/Heme/Allergies: Negative.   Psychiatric/Behavioral: Negative.       Objective:   Vitals:   09/01/23 1548  BP: 136/86  Pulse: 90  Resp: 18  Temp: 97.7 F (36.5 C)  SpO2: 94%   Height: 6\' 4"  (193 cm)  Weight: 260 lb 4.8 oz (118.1 kg)   Physical Exam Constitutional:      Appearance: He is not diaphoretic.  HENT:     Head: Normocephalic.     Right Ear: Tympanic membrane, ear canal and external ear normal.     Left Ear: Tympanic membrane, ear canal and external ear normal.     Nose: Nose normal. No mucosal edema or rhinorrhea.     Mouth/Throat:     Pharynx: Uvula midline. No oropharyngeal exudate.  Eyes:     Conjunctiva/sclera: Conjunctivae normal.  Neck:     Thyroid: No thyromegaly.     Trachea: Trachea normal. No tracheal tenderness or tracheal deviation.  Cardiovascular:     Rate and Rhythm: Normal rate and regular rhythm.     Heart sounds: Normal heart sounds, S1 normal and S2 normal. No  murmur heard. Pulmonary:     Effort: No respiratory distress.     Breath sounds: Normal breath sounds. No stridor. No wheezing or rales.  Lymphadenopathy:     Head:     Right side of head: No tonsillar adenopathy.     Left side of head: No tonsillar adenopathy.     Cervical: No cervical adenopathy.  Skin:    Findings: No erythema or rash.     Nails: There is no clubbing.  Neurological:     Mental Status: He is alert.     Diagnostics: none  Assessment and Plan:   1. Perennial allergic rhinitis   2. Migraine syndrome    1. Allergen avoidance measures - dust mite  2. Treat and prevent inflammation of airway:   A. RYALTRIS - 1-2 sprays each nostril 1-2 times per day (SP)  3. If needed:   A. Antihistamine  B. Nasal saline  4. Consider a course of immunotherapy if medical therapy fails  5. Return to clinic in 1 year or earlier if problem  6. Plan for fall flu vaccine  We will give Thomas [facility]row a different form of nasal steroid/antihistamine and hopefully his insurance company will pay for this agents.  If he fails medical therapy which includes a combination of allergen avoidance measures and the use of medications as noted above, then he would be a candidate for immunotherapy and he should consider starting this form of treatment if his medical therapy does not work adequately.  Laurette Schimke, MD Allergy / Immunology [facility]

## 2023-09-01 NOTE — Patient Instructions (Signed)
  1. Allergen avoidance measures - dust mite  2. Treat and prevent inflammation of airway:   A. RYALTRIS - 1-2 sprays each nostril 1-2 times per day (SP)  3. If needed:   A. Antihistamine  B. Nasal saline  4. Consider a course of immunotherapy if medical therapy fails  5. Return to clinic in 1 year or earlier if problem  6. Plan for fall flu vaccine

## 2023-09-02 ENCOUNTER — Encounter: Payer: Self-pay | Admitting: Allergy and Immunology

## 2023-09-02 MED ORDER — RYALTRIS 665-25 MCG/ACT NA SUSP: NASAL | 5 refills | Status: AC

## 2023-09-02 NOTE — Addendum Note (Signed)
Addended by: Orson Aloe on: 09/02/2023 05:41 PM   Modules accepted: Orders

## 2023-09-03 ENCOUNTER — Other Ambulatory Visit (HOSPITAL_COMMUNITY): Payer: Self-pay

## 2023-09-03 ENCOUNTER — Telehealth: Payer: Self-pay

## 2023-09-03 NOTE — Telephone Encounter (Signed)
*  Asthma/Allergy  Pharmacy Patient Advocate Encounter  Received notification from [facility] that Prior Authorization for Ryaltris 665-25MCG/ACT suspension  has been APPROVED from 09/03/2023 to 09/01/2024. Ran test claim, Copay is $44.99. This test claim was processed through [facility]- copay amounts may vary at other pharmacies due to pharmacy/plan contracts, or as the patient moves through the different stages of their insurance plan.   PA #/Case ID/Reference #: [id number]

## 2023-09-15 ENCOUNTER — Other Ambulatory Visit: Payer: Self-pay

## 2023-09-15 DIAGNOSIS — R300 Dysuria: Secondary | ICD-10-CM | POA: Diagnosis not present

## 2023-09-15 NOTE — Progress Notes (Signed)
Specialty Pharmacy Refill Coordination Note  Thomas [facility]row is a 62 y.o. male contacted today regarding refills of specialty medication(s) Alirocumab   Patient requested Delivery   Delivery date: 09/25/23   Verified address: [address], [place], [address]   Medication will be filled on 09/24/23.   Call was mistimed and pt should have dose on hand for 11/20 injection. This shipment will provide injections for 12/18 and 1/15. Call is timed to 12/16/23.

## 2023-09-24 ENCOUNTER — Other Ambulatory Visit: Payer: Self-pay

## 2023-10-15 DIAGNOSIS — H524 Presbyopia: Secondary | ICD-10-CM | POA: Diagnosis not present

## 2023-12-11 ENCOUNTER — Other Ambulatory Visit: Payer: Self-pay

## 2023-12-11 ENCOUNTER — Encounter (HOSPITAL_COMMUNITY): Payer: Self-pay

## 2023-12-11 NOTE — Progress Notes (Signed)
Specialty Pharmacy Refill Coordination Note  REAL CONA is a 63 y.o. male contacted today regarding refills of specialty medication(s) Alirocumab (Praluent)   Patient requested Delivery   Delivery date: 12/17/23   Verified address: [address], [place], [address]   Medication will be filled on 12/16/23. Pending PA.   Injection is due second week of February.

## 2023-12-11 NOTE — Progress Notes (Signed)
Specialty Pharmacy Ongoing Clinical Assessment Note  Thomas Morrow is a 63 y.o. male who is being followed by the specialty pharmacy service for RxSp Hyperlipidemia   Patient's specialty medication(s) reviewed today: Alirocumab (Praluent)   Missed doses in the last 4 weeks: 0   Patient/Caregiver did not have any additional questions or concerns.   Therapeutic benefit summary: Patient is achieving benefit   Adverse events/side effects summary: No adverse events/side effects   Patient's therapy is appropriate to: Continue    Goals Addressed             This Visit's Progress    Stabilization of disease       Patient is on track. Patient will maintain adherence         Follow up:  6 months  Bobette Mo Specialty Pharmacist

## 2023-12-14 ENCOUNTER — Other Ambulatory Visit: Payer: Self-pay

## 2023-12-14 NOTE — Progress Notes (Signed)
Pharmacy Patient Advocate Encounter   Received notification from Patient Pharmacy that prior authorization for Praluent is required/requested.   Insurance verification completed.   The patient is insured through [employer] .   Per test claim: PA required; PA submitted to above mentioned insurance via CoverMyMeds Key/confirmation #/EOC [id number] Status is pending

## 2023-12-15 ENCOUNTER — Other Ambulatory Visit: Payer: Self-pay

## 2023-12-15 NOTE — Progress Notes (Signed)
Pharmacy Patient Advocate Encounter  Received notification from [facility] that Prior Authorization for Praluent has been DENIED.   Coverage is provided in situations where the patient's coronary artery calcium or  calcification (CAC) score is greater than or equal to 300 Agatston units; OR, the patient  has diabetes. Coverage cannot be authorized at this time.   Will fax provider denial letter. They will have to appeal or change medication to covered alternative.  PA #/Case ID/Reference #: [id number]

## 2023-12-17 ENCOUNTER — Other Ambulatory Visit: Payer: Self-pay

## 2024-01-01 DIAGNOSIS — R7309 Other abnormal glucose: Secondary | ICD-10-CM | POA: Diagnosis not present

## 2024-01-01 DIAGNOSIS — I2510 Atherosclerotic heart disease of native coronary artery without angina pectoris: Secondary | ICD-10-CM | POA: Diagnosis not present

## 2024-01-01 DIAGNOSIS — E785 Hyperlipidemia, unspecified: Secondary | ICD-10-CM | POA: Diagnosis not present

## 2024-01-05 ENCOUNTER — Other Ambulatory Visit: Payer: Self-pay

## 2024-01-05 ENCOUNTER — Other Ambulatory Visit (HOSPITAL_COMMUNITY): Payer: Self-pay

## 2024-01-05 DIAGNOSIS — I2510 Atherosclerotic heart disease of native coronary artery without angina pectoris: Secondary | ICD-10-CM | POA: Diagnosis not present

## 2024-01-05 DIAGNOSIS — E785 Hyperlipidemia, unspecified: Secondary | ICD-10-CM | POA: Diagnosis not present

## 2024-01-05 DIAGNOSIS — M791 Myalgia, unspecified site: Secondary | ICD-10-CM | POA: Diagnosis not present

## 2024-01-05 DIAGNOSIS — Z8249 Family history of ischemic heart disease and other diseases of the circulatory system: Secondary | ICD-10-CM | POA: Diagnosis not present

## 2024-01-05 MED ORDER — PRALUENT 75 MG/ML ~~LOC~~ SOAJ
SUBCUTANEOUS | 3 refills | Status: DC
  Filled 2024-01-11: qty 2, 30d supply, fill #0
  Filled 2024-02-23: qty 2, 30d supply, fill #1
  Filled 2024-03-25: qty 2, 30d supply, fill #2
  Filled 2024-05-16: qty 2, 30d supply, fill #3
  Filled 2024-05-31: qty 2, 30d supply, fill #4
  Filled 2024-07-12 (×2): qty 2, 30d supply, fill #5
  Filled 2024-08-29 (×2): qty 2, 30d supply, fill #6
  Filled 2024-09-29 – 2024-10-19 (×4): qty 2, 30d supply, fill #7

## 2024-01-06 ENCOUNTER — Other Ambulatory Visit: Payer: Self-pay

## 2024-01-11 ENCOUNTER — Other Ambulatory Visit (HOSPITAL_COMMUNITY): Payer: Self-pay

## 2024-01-11 ENCOUNTER — Other Ambulatory Visit: Payer: Self-pay

## 2024-01-11 NOTE — Progress Notes (Signed)
 Specialty Pharmacy Refill Coordination Note  Thomas Morrow is a 63 y.o. male contacted today regarding refills of specialty medication(s) Alirocumab (Praluent)   Patient requested Delivery   Delivery date: 01/12/24   Verified address: [address] [facility] [place] [address]   Medication will be filled on 01/11/24.

## 2024-01-12 ENCOUNTER — Other Ambulatory Visit: Payer: Self-pay

## 2024-02-17 DIAGNOSIS — Z1211 Encounter for screening for malignant neoplasm of colon: Secondary | ICD-10-CM | POA: Diagnosis not present

## 2024-02-17 DIAGNOSIS — Z800 Family history of malignant neoplasm of digestive organs: Secondary | ICD-10-CM | POA: Diagnosis not present

## 2024-02-23 ENCOUNTER — Other Ambulatory Visit: Payer: Self-pay | Admitting: Pharmacy Technician

## 2024-02-23 ENCOUNTER — Other Ambulatory Visit: Payer: Self-pay

## 2024-02-23 NOTE — Progress Notes (Signed)
 Specialty Pharmacy Refill Coordination Note  Thomas Morrow is a 63 y.o. male contacted today regarding refills of specialty medication(s) Alirocumab (Praluent)   Patient requested Delivery   Delivery date: 02/24/24   Verified address: [address]  [facility] [place]   Medication will be filled on 02/23/24.

## 2024-03-09 DIAGNOSIS — L218 Other seborrheic dermatitis: Secondary | ICD-10-CM | POA: Diagnosis not present

## 2024-03-09 DIAGNOSIS — L570 Actinic keratosis: Secondary | ICD-10-CM | POA: Diagnosis not present

## 2024-03-09 DIAGNOSIS — D225 Melanocytic nevi of trunk: Secondary | ICD-10-CM | POA: Diagnosis not present

## 2024-03-09 DIAGNOSIS — Z1283 Encounter for screening for malignant neoplasm of skin: Secondary | ICD-10-CM | POA: Diagnosis not present

## 2024-03-09 DIAGNOSIS — X32XXXD Exposure to sunlight, subsequent encounter: Secondary | ICD-10-CM | POA: Diagnosis not present

## 2024-03-09 DIAGNOSIS — L281 Prurigo nodularis: Secondary | ICD-10-CM | POA: Diagnosis not present

## 2024-03-10 DIAGNOSIS — K635 Polyp of colon: Secondary | ICD-10-CM | POA: Diagnosis not present

## 2024-03-10 DIAGNOSIS — Z800 Family history of malignant neoplasm of digestive organs: Secondary | ICD-10-CM | POA: Diagnosis not present

## 2024-03-10 DIAGNOSIS — K5730 Diverticulosis of large intestine without perforation or abscess without bleeding: Secondary | ICD-10-CM | POA: Diagnosis not present

## 2024-03-10 DIAGNOSIS — Z1211 Encounter for screening for malignant neoplasm of colon: Secondary | ICD-10-CM | POA: Diagnosis not present

## 2024-03-10 DIAGNOSIS — D123 Benign neoplasm of transverse colon: Secondary | ICD-10-CM | POA: Diagnosis not present

## 2024-03-10 DIAGNOSIS — Z860101 Personal history of adenomatous and serrated colon polyps: Secondary | ICD-10-CM | POA: Diagnosis not present

## 2024-03-23 ENCOUNTER — Other Ambulatory Visit: Payer: Self-pay

## 2024-03-25 ENCOUNTER — Other Ambulatory Visit: Payer: Self-pay

## 2024-03-25 NOTE — Progress Notes (Signed)
 Specialty Pharmacy Refill Coordination Note  Thomas Morrow is a 63 y.o. male contacted today regarding refills of specialty medication(s) Alirocumab  (Praluent )   Patient requested Delivery   Delivery date: 04/01/24   Verified address: [address]  [facility]   Medication will be filled on 03/31/24.

## 2024-03-31 ENCOUNTER — Other Ambulatory Visit: Payer: Self-pay

## 2024-04-25 DIAGNOSIS — R7303 Prediabetes: Secondary | ICD-10-CM | POA: Diagnosis not present

## 2024-04-25 DIAGNOSIS — Z0000 Encounter for general adult medical examination without abnormal findings: Secondary | ICD-10-CM | POA: Diagnosis not present

## 2024-04-25 DIAGNOSIS — Z125 Encounter for screening for malignant neoplasm of prostate: Secondary | ICD-10-CM | POA: Diagnosis not present

## 2024-04-25 DIAGNOSIS — I10 Essential (primary) hypertension: Secondary | ICD-10-CM | POA: Diagnosis not present

## 2024-05-02 DIAGNOSIS — I2510 Atherosclerotic heart disease of native coronary artery without angina pectoris: Secondary | ICD-10-CM | POA: Diagnosis not present

## 2024-05-02 DIAGNOSIS — R911 Solitary pulmonary nodule: Secondary | ICD-10-CM | POA: Diagnosis not present

## 2024-05-02 DIAGNOSIS — R7303 Prediabetes: Secondary | ICD-10-CM | POA: Diagnosis not present

## 2024-05-02 DIAGNOSIS — E785 Hyperlipidemia, unspecified: Secondary | ICD-10-CM | POA: Diagnosis not present

## 2024-05-02 DIAGNOSIS — Z0000 Encounter for general adult medical examination without abnormal findings: Secondary | ICD-10-CM | POA: Diagnosis not present

## 2024-05-09 DIAGNOSIS — Z6831 Body mass index (BMI) 31.0-31.9, adult: Secondary | ICD-10-CM | POA: Diagnosis not present

## 2024-05-09 DIAGNOSIS — I2510 Atherosclerotic heart disease of native coronary artery without angina pectoris: Secondary | ICD-10-CM | POA: Diagnosis not present

## 2024-05-09 DIAGNOSIS — R972 Elevated prostate specific antigen [PSA]: Secondary | ICD-10-CM | POA: Diagnosis not present

## 2024-05-09 DIAGNOSIS — R030 Elevated blood-pressure reading, without diagnosis of hypertension: Secondary | ICD-10-CM | POA: Diagnosis not present

## 2024-05-16 ENCOUNTER — Other Ambulatory Visit: Payer: Self-pay | Admitting: Pharmacy Technician

## 2024-05-16 ENCOUNTER — Other Ambulatory Visit: Payer: Self-pay

## 2024-05-16 NOTE — Progress Notes (Signed)
 Specialty Pharmacy Refill Coordination Note  Thomas Morrow is a 63 y.o. male contacted today regarding refills of specialty medication(s) Alirocumab  (Praluent )   Patient requested Delivery   Delivery date: 05/17/24   Verified address: [address] [facility] [place] [address]   Medication will be filled on 05/16/24.

## 2024-05-31 ENCOUNTER — Other Ambulatory Visit: Payer: Self-pay

## 2024-05-31 NOTE — Progress Notes (Signed)
 Specialty Pharmacy Refill Coordination Note  Thomas Morrow is a 63 y.o. male contacted today regarding refills of specialty medication(s) Alirocumab  (Praluent )   Patient requested (Patient-Rptd) Delivery   Delivery date: 06/08/24   Verified address: (Patient-Rptd) [address], [place], [place] [address]   Medication will be filled on 07.29.25.

## 2024-06-07 ENCOUNTER — Other Ambulatory Visit: Payer: Self-pay

## 2024-06-27 ENCOUNTER — Other Ambulatory Visit: Payer: Self-pay

## 2024-07-12 ENCOUNTER — Other Ambulatory Visit: Payer: Self-pay | Admitting: Pharmacy Technician

## 2024-07-12 ENCOUNTER — Other Ambulatory Visit: Payer: Self-pay

## 2024-07-12 ENCOUNTER — Encounter (INDEPENDENT_AMBULATORY_CARE_PROVIDER_SITE_OTHER): Payer: Self-pay

## 2024-07-12 NOTE — Progress Notes (Signed)
 Specialty Pharmacy Refill Coordination Note  CARREL LEATHER is a 63 y.o. male contacted today regarding refills of specialty medication(s) Alirocumab  (Praluent )   Patient requested (Patient-Rptd) Delivery   Delivery date: 08/03/24 Verified address: (Patient-Rptd) [address] [place], [place] [address]   Medication will be filled on 08/02/24.

## 2024-08-02 ENCOUNTER — Other Ambulatory Visit: Payer: Self-pay

## 2024-08-29 ENCOUNTER — Other Ambulatory Visit: Payer: Self-pay

## 2024-08-29 ENCOUNTER — Encounter (INDEPENDENT_AMBULATORY_CARE_PROVIDER_SITE_OTHER): Payer: Self-pay

## 2024-08-29 NOTE — Progress Notes (Signed)
 Specialty Pharmacy Refill Coordination Note  Thomas Morrow is a 63 y.o. male contacted today regarding refills of specialty medication(s) Alirocumab  (Praluent )   Patient requested Delivery   Delivery date: 09/07/24   Verified address: [address] [place], [place] [address]   Medication will be filled on 09/06/24.

## 2024-09-06 ENCOUNTER — Other Ambulatory Visit: Payer: Self-pay

## 2024-09-06 ENCOUNTER — Ambulatory Visit: Payer: BC Managed Care – PPO | Admitting: Allergy and Immunology

## 2024-09-06 ENCOUNTER — Encounter: Payer: Self-pay | Admitting: Allergy and Immunology

## 2024-09-06 VITALS — BP 146/98 | HR 92 | Temp 98.1°F | Resp 16 | Ht 76.0 in | Wt 251.8 lb

## 2024-09-06 DIAGNOSIS — J3089 Other allergic rhinitis: Secondary | ICD-10-CM | POA: Diagnosis not present

## 2024-09-06 MED ORDER — CETIRIZINE HCL 10 MG PO TABS
10.0000 mg | ORAL_TABLET | Freq: Every day | ORAL | 5 refills | Status: AC
Start: 2024-09-06 — End: ?

## 2024-09-06 MED ORDER — AZELASTINE-FLUTICASONE 137-50 MCG/ACT NA SUSP
NASAL | 5 refills | Status: AC
Start: 2024-09-06 — End: ?

## 2024-09-06 NOTE — Progress Notes (Unsigned)
 [facility] [place] [facility] [place] [facility] [place]   Follow-up Note  Referring Provider: Clarice Nottingham, MD Primary Provider: Clarice Nottingham, MD Date of Office Visit: 09/06/2024  Subjective:   Thomas Morrow (DOB: August 15, 1961) is a 63 y.o. male who [facility] in re-evaluation of the following:  HPI: Thomas Morrow presents to this clinic in evaluation of his allergic rhinitis.  I last saw him in this clinic 01 September 2023.  He has been using generic Dymista  most days anything set this really works quite well regarding his upper airway symptoms and he has not really had much headache.  Occasionally he does get a little congested and I will take a decongestant but that seems to give rise to some urinary hesitancy.  It does not sound as though he is required a systemic steroid or an antibiotic for any type of airway issue.  Allergies as of 09/06/2024       Reactions   Chocolate    Other Reaction(s): itching if too much   Simvastatin Other (See Comments)        Medication List    aspirin EC 81 MG tablet 1 tablet   augmented betamethasone dipropionate 0.05 % cream Commonly known as: DIPROLENE-AF Apply topically daily.   Azelastine -Fluticasone  137-50 MCG/ACT Susp Commonly known as: Dymista  1 spray each nostril 1-2 times per day   cetirizine  10 MG tablet Commonly known as: ZYRTEC  Take 1 tablet (10 mg total) by mouth daily.   Claritin-D 12 Hour 5-120 MG tablet Generic drug: loratadine-pseudoephedrine 1 tablet as needed   [employer] 1 % cream Generic drug: terbinafine Apply 1 Application topically daily.   fluticasone  0.05 % cream Commonly known as: CUTIVATE Apply 1 Application topically 2 (two) times daily.   omega-3 acid ethyl esters 1 g capsule Commonly known as: LOVAZA   Praluent  75 MG/ML Soaj Generic drug: Alirocumab  Inject 75 mg subcutaneous every 14 days, Replaces Repatha    Praluent  75 MG/ML  Soaj Generic drug: Alirocumab  Inject 75 mg subcutaneous every 14 days, Replaces Repatha  90 days   Praluent  75 MG/ML Soaj Generic drug: Alirocumab  Inject 75 mg subcutaneous every 14 days Subcutaneous 90 days   Praluent  75 MG/ML Soaj Generic drug: Alirocumab  Inject 75 mg subcutaneous every month. Subcutaneous once a month 90 days   Praluent  75 MG/ML Soaj Generic drug: Alirocumab  75 mg Subcutaneous every 3 weeks 84 days   Repatha  SureClick 140 MG/ML Soaj Generic drug: Evolocumab  Inject 140 mg under the skin every 14 days.   Ryaltris  665-25 MCG/ACT Susp Generic drug: Olopatadine-Mometasone 1-2 sprays each nostril 1-2 times per day    Past Medical History:  Diagnosis Date   Allergy     Eczema    Second hand smoke exposure    Sleep apnea     Past Surgical History:  Procedure Laterality Date   Appendicitis  2000   Tonsilltis  1965    Review of systems negative except as noted in HPI / PMHx or noted below:  Review of Systems  Constitutional: Negative.   HENT: Negative.    Eyes: Negative.   Respiratory: Negative.    Cardiovascular: Negative.   Gastrointestinal: Negative.   Genitourinary: Negative.   Musculoskeletal: Negative.   Skin: Negative.   Neurological: Negative.   Endo/Heme/Allergies: Negative.   Psychiatric/Behavioral: Negative.       Objective:   Vitals:   09/06/24 0842  BP: (!) 146/98  Pulse: 92  Resp: 16  Temp: 98.1  F (36.7 C)  SpO2: 97%   Height: 6' 4 (193 cm)  Weight: 251 lb 12.8 oz (114.2 kg)   Physical Exam Constitutional:      Appearance: He is not diaphoretic.  HENT:     Head: Normocephalic.     Right Ear: Tympanic membrane, ear canal and external ear normal.     Left Ear: Tympanic membrane, ear canal and external ear normal.     Nose: Nose normal. No mucosal edema or rhinorrhea.     Mouth/Throat:     Pharynx: Uvula midline. No oropharyngeal exudate.  Eyes:     Conjunctiva/sclera: Conjunctivae normal.  Neck:     Thyroid : No  thyromegaly.     Trachea: Trachea normal. No tracheal tenderness or tracheal deviation.  Cardiovascular:     Rate and Rhythm: Normal rate and regular rhythm.     Heart sounds: Normal heart sounds, S1 normal and S2 normal. No murmur heard. Pulmonary:     Effort: No respiratory distress.     Breath sounds: Normal breath sounds. No stridor. No wheezing or rales.  Lymphadenopathy:     Head:     Right side of head: No tonsillar adenopathy.     Left side of head: No tonsillar adenopathy.     Cervical: No cervical adenopathy.  Skin:    Findings: No erythema or rash.     Nails: There is no clubbing.  Neurological:     Mental Status: He is alert.     Diagnostics: none  Assessment and Plan:   1. Perennial allergic rhinitis     1. Allergen avoidance measures - dust mite  2. Treat and prevent inflammation of airway:   A. Generic Dymista  - 1-2 sprays each nostril 1-2 times per day (paper script)  3. If needed:   A. Antihistamine  B. Nasal saline  4. Influenza = Tamiflu. Covid = Paxlovid  5. Return to clinic in 1 year or earlier if problem  6. Plan for fall flu vaccine  Thomas Morrow is doing pretty well while using Dymista  mostly just 1 time per day and he has the option of going up to twice a day should he develop more problems with his airway.  Assuming he does well we can see him back in this clinic in 1 year or earlier if there is a problem.  Thomas Denis, MD Allergy  / Immunology [facility]

## 2024-09-06 NOTE — Patient Instructions (Signed)
  1. Allergen avoidance measures - dust mite  2. Treat and prevent inflammation of airway:   A. Generic Dymista  - 1-2 sprays each nostril 1-2 times per day (paper script)  3. If needed:   A. Antihistamine  B. Nasal saline  4. Influenza = Tamiflu. Covid = Paxlovid  5. Return to clinic in 1 year or earlier if problem  6. Plan for fall flu vaccine

## 2024-09-07 ENCOUNTER — Encounter: Payer: Self-pay | Admitting: Allergy and Immunology

## 2024-09-09 ENCOUNTER — Other Ambulatory Visit: Payer: Self-pay

## 2024-09-29 ENCOUNTER — Other Ambulatory Visit (HOSPITAL_COMMUNITY): Payer: Self-pay

## 2024-10-03 ENCOUNTER — Other Ambulatory Visit: Payer: Self-pay

## 2024-10-08 ENCOUNTER — Other Ambulatory Visit (HOSPITAL_COMMUNITY): Payer: Self-pay

## 2024-10-10 ENCOUNTER — Other Ambulatory Visit: Payer: Self-pay

## 2024-10-18 ENCOUNTER — Other Ambulatory Visit: Payer: Self-pay

## 2024-10-19 ENCOUNTER — Other Ambulatory Visit: Payer: Self-pay

## 2024-10-19 ENCOUNTER — Other Ambulatory Visit (HOSPITAL_COMMUNITY): Payer: Self-pay

## 2024-10-19 NOTE — Progress Notes (Signed)
 Specialty Pharmacy Refill Coordination Note  Thomas Morrow is a 63 y.o. male contacted today regarding refills of specialty medication(s) Alirocumab  (Praluent )   Patient requested Delivery   Delivery date: 10/21/24   Verified address: [address] [place], [place] [address]   Medication will be filled on: 10/20/24

## 2024-11-09 ENCOUNTER — Other Ambulatory Visit: Payer: Self-pay

## 2024-11-11 ENCOUNTER — Other Ambulatory Visit: Payer: Self-pay

## 2024-11-14 ENCOUNTER — Other Ambulatory Visit: Payer: Self-pay

## 2024-11-14 MED ORDER — PRALUENT 75 MG/ML ~~LOC~~ SOAJ
75.0000 mg | SUBCUTANEOUS | 3 refills | Status: AC
  Filled 2024-11-21: qty 2, 42d supply, fill #0

## 2024-11-21 ENCOUNTER — Other Ambulatory Visit: Payer: Self-pay

## 2024-11-21 NOTE — Progress Notes (Signed)
 Specialty Pharmacy Refill Coordination Note  Thomas Morrow is a 64 y.o. male contacted today regarding refills of specialty medication(s) Alirocumab  (Praluent )   Patient requested Delivery   Delivery date: 12/08/24   Verified address: [address] [place], [place] [address]   Medication will be filled on: 12/07/24

## 2024-12-07 ENCOUNTER — Other Ambulatory Visit: Payer: Self-pay

## 2025-09-05 ENCOUNTER — Ambulatory Visit: Admitting: Allergy and Immunology
